# Patient Record
Sex: Female | Born: 1980 | Race: White | Hispanic: No | Smoking: Never smoker
Health system: Southern US, Community
[De-identification: ages and names within clinical notes are randomized; demographics above are authoritative.]

## PROBLEM LIST (undated history)

## (undated) ENCOUNTER — Inpatient Hospital Stay (HOSPITAL_COMMUNITY): Payer: Self-pay

## (undated) DIAGNOSIS — T4145XA Adverse effect of unspecified anesthetic, initial encounter: Secondary | ICD-10-CM

## (undated) DIAGNOSIS — O99345 Other mental disorders complicating the puerperium: Secondary | ICD-10-CM

## (undated) DIAGNOSIS — I499 Cardiac arrhythmia, unspecified: Secondary | ICD-10-CM

## (undated) DIAGNOSIS — G8929 Other chronic pain: Secondary | ICD-10-CM

## (undated) DIAGNOSIS — R12 Heartburn: Secondary | ICD-10-CM

## (undated) DIAGNOSIS — F419 Anxiety disorder, unspecified: Secondary | ICD-10-CM

## (undated) DIAGNOSIS — F329 Major depressive disorder, single episode, unspecified: Secondary | ICD-10-CM

## (undated) DIAGNOSIS — F53 Postpartum depression: Secondary | ICD-10-CM

## (undated) DIAGNOSIS — O26899 Other specified pregnancy related conditions, unspecified trimester: Secondary | ICD-10-CM

## (undated) HISTORY — PX: TONSILLECTOMY: SUR1361

## (undated) HISTORY — DX: Anxiety disorder, unspecified: F41.9

## (undated) HISTORY — DX: Other mental disorders complicating the puerperium: O99.345

## (undated) HISTORY — DX: Postpartum depression: F53.0

## (undated) HISTORY — DX: Other chronic pain: G89.29

---
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
## 2001-08-10 ENCOUNTER — Encounter: Payer: Self-pay | Admitting: Surgery

## 2001-08-10 ENCOUNTER — Encounter: Admission: RE | Admit: 2001-08-10 | Discharge: 2001-08-10 | Payer: Self-pay | Admitting: Surgery

## 2001-08-31 ENCOUNTER — Encounter: Admission: RE | Admit: 2001-08-31 | Discharge: 2001-08-31 | Payer: Self-pay | Admitting: Surgery

## 2001-08-31 ENCOUNTER — Encounter: Payer: Self-pay | Admitting: Surgery

## 2001-11-16 ENCOUNTER — Encounter: Admission: RE | Admit: 2001-11-16 | Discharge: 2001-11-16 | Payer: Self-pay | Admitting: Surgery

## 2001-11-16 ENCOUNTER — Encounter: Payer: Self-pay | Admitting: Surgery

## 2002-02-14 ENCOUNTER — Other Ambulatory Visit: Admission: RE | Admit: 2002-02-14 | Discharge: 2002-02-14 | Payer: Self-pay | Admitting: Obstetrics and Gynecology

## 2002-08-06 ENCOUNTER — Encounter: Payer: Self-pay | Admitting: Neurosurgery

## 2002-08-06 ENCOUNTER — Ambulatory Visit (HOSPITAL_COMMUNITY): Admission: RE | Admit: 2002-08-06 | Discharge: 2002-08-06 | Payer: Self-pay | Admitting: Neurosurgery

## 2002-09-12 ENCOUNTER — Inpatient Hospital Stay (HOSPITAL_COMMUNITY): Admission: AD | Admit: 2002-09-12 | Discharge: 2002-09-17 | Payer: Self-pay | Admitting: Obstetrics and Gynecology

## 2002-09-12 ENCOUNTER — Encounter: Payer: Self-pay | Admitting: Obstetrics and Gynecology

## 2002-10-16 ENCOUNTER — Other Ambulatory Visit: Admission: RE | Admit: 2002-10-16 | Discharge: 2002-10-16 | Payer: Self-pay | Admitting: Obstetrics and Gynecology

## 2003-11-26 ENCOUNTER — Other Ambulatory Visit: Admission: RE | Admit: 2003-11-26 | Discharge: 2003-11-26 | Payer: Self-pay | Admitting: Obstetrics and Gynecology

## 2004-09-06 ENCOUNTER — Encounter: Admission: RE | Admit: 2004-09-06 | Discharge: 2004-09-06 | Payer: Self-pay | Admitting: Neurosurgery

## 2004-09-24 ENCOUNTER — Encounter: Admission: RE | Admit: 2004-09-24 | Discharge: 2004-09-24 | Payer: Self-pay | Admitting: Anesthesiology

## 2005-03-28 ENCOUNTER — Encounter: Admission: RE | Admit: 2005-03-28 | Discharge: 2005-03-28 | Payer: Self-pay | Admitting: Neurosurgery

## 2008-02-14 ENCOUNTER — Encounter (INDEPENDENT_AMBULATORY_CARE_PROVIDER_SITE_OTHER): Payer: Self-pay | Admitting: Obstetrics and Gynecology

## 2008-02-14 ENCOUNTER — Inpatient Hospital Stay (HOSPITAL_COMMUNITY): Admission: RE | Admit: 2008-02-14 | Discharge: 2008-02-16 | Payer: Self-pay | Admitting: Obstetrics and Gynecology

## 2008-12-22 ENCOUNTER — Encounter: Admission: RE | Admit: 2008-12-22 | Discharge: 2008-12-22 | Payer: Self-pay | Admitting: Family Medicine

## 2009-11-08 HISTORY — PX: KNEE ARTHROSCOPY: SHX127

## 2010-11-29 ENCOUNTER — Encounter: Payer: Self-pay | Admitting: Family Medicine

## 2011-01-21 ENCOUNTER — Emergency Department (HOSPITAL_COMMUNITY): Payer: 59

## 2011-01-21 ENCOUNTER — Emergency Department (HOSPITAL_COMMUNITY)
Admission: EM | Admit: 2011-01-21 | Discharge: 2011-01-21 | Disposition: A | Discharge status: Home / Self Care | Payer: 59 | Attending: Emergency Medicine | Admitting: Emergency Medicine

## 2011-01-21 DIAGNOSIS — R0609 Other forms of dyspnea: Secondary | ICD-10-CM | POA: Insufficient documentation

## 2011-01-21 DIAGNOSIS — R079 Chest pain, unspecified: Secondary | ICD-10-CM | POA: Insufficient documentation

## 2011-01-21 DIAGNOSIS — R0989 Other specified symptoms and signs involving the circulatory and respiratory systems: Secondary | ICD-10-CM | POA: Insufficient documentation

## 2011-01-21 DIAGNOSIS — R0602 Shortness of breath: Secondary | ICD-10-CM | POA: Insufficient documentation

## 2011-01-21 DIAGNOSIS — J45909 Unspecified asthma, uncomplicated: Secondary | ICD-10-CM | POA: Insufficient documentation

## 2011-01-21 LAB — POCT CARDIAC MARKERS
Myoglobin, poc: 31.9 ng/mL (ref 12–200)
Troponin i, poc: <0.05 ng/mL (ref 0.00–0.09)

## 2011-01-21 LAB — BASIC METABOLIC PANEL
Calcium: 9.1 mg/dL (ref 8.4–10.5)
Chloride: 105 mEq/L (ref 96–112)
Creatinine, Ser: 0.8 mg/dL (ref 0.4–1.2)
GFR calc Af Amer: >60 mL/min (ref >60)
GFR calc non Af Amer: >60 mL/min (ref >60)
Glucose, Bld: 96 mg/dL (ref 70–99)
Sodium: 137 mEq/L (ref 135–145)

## 2011-01-21 LAB — DIFFERENTIAL
Basophils Absolute: 0.1 10*3/uL (ref 0.0–0.1)
Basophils Relative: 1 % (ref 0–1)
Eosinophils Absolute: 0.1 10*3/uL (ref 0.0–0.7)
Eosinophils Relative: 2 % (ref 0–5)
Lymphocytes Relative: 47 % — ABNORMAL HIGH (ref 12–46)
Lymphs Abs: 4.3 10*3/uL — ABNORMAL HIGH (ref 0.7–4.0)
Monocytes Relative: 5 % (ref 3–12)
Neutro Abs: 4.3 10*3/uL (ref 1.7–7.7)

## 2011-01-21 LAB — CBC
MCH: 31.9 pg (ref 26.0–34.0)
MCV: 90.9 fL (ref 78.0–100.0)
RDW: 12.9 % (ref 11.5–15.5)
WBC: 9.3 10*3/uL (ref 4.0–10.5)

## 2011-01-21 LAB — D-DIMER, QUANTITATIVE: D-Dimer, Quant: 0.22 ug/mL-FEU (ref 0.00–0.48)

## 2011-03-23 NOTE — Op Note (Signed)
Joanne Mcguire, BENBROOK               ACCOUNT NO.:  [id number]   MEDICAL RECORD NO.:  [id number]          PATIENT TYPE:  INP   LOCATION:  9199                          FACILITY:  [facility]   PHYSICIAN:  Lenoard Aden, M.D.DATE OF BIRTH:  1981-07-26   DATE OF PROCEDURE:  02/14/2008  DATE OF DISCHARGE:                               OPERATIVE REPORT   PREOPERATIVE DIAGNOSIS:  Previous cesarean section, 38 weeks, low lying  placenta versus marginal placenta previa.   POSTOPERATIVE DIAGNOSIS:  Previous cesarean section, 38 weeks, marginal  placenta previa.   PROCEDURE:  Repeat low segment transverse cesarean section.   SURGEON:  Lenoard Aden, M.D.   ASSISTANT:  Marlinda Mike, C.N.M.   ANESTHESIA:  Spinal by Dr. Arby Barrette.   ESTIMATED BLOOD LOSS:  1000 mL.   COMPLICATIONS:  None.   DRAINS:  Foley.   COUNTS:  Correct.   DISPOSITION:  Patient to recovery in good condition.   BRIEF OPERATIVE NOTE:  After being apprised of risks of anesthesia,  infection, bleeding, injury to abdominal organs with need for repair,  delayed versus immediate complications to include bowel and bladder  injury, the patient was brought to the operating where she was  administered a spinal anesthetic without complications, prepped and  draped in the usual sterile fashion.  A Foley catheter was placed.  After achieving adequate with dilute Marcaine solution, a skin incision  was made with a scalpel and carried down to the fascia which was nicked  in the midline and entered transversely using Mayo scissors.  The rectus  muscles were dissected sharply in the midline and the peritoneum entered  sharply.  A bladder blade was placed.  The visceral peritoneum was  scored sharply off the lower uterine segment.  Kerr hysterotomy incision  was made. Atraumatic delivery full term living female, occiput transverse  position, handed to the pediatrician in attendance, Apgars 8/9.  Placenta delivered from a posterior  location marginal previa manually  and intact, three vessel cord noted.  Uterus curetted using a dry lap  pack.  Good hemostasis noted.  The uterus closed in two running layer of  0 Monocryl suture.  The tubes and ovaries appear normal.  The posterior  cul-de-sac appears normal.  Good hemostasis is achieved.  The bladder  flap was inspected and found to be  hemostatic.  Irrigation was accomplished.  The fascia was closed using 0  Monocryl in a running fashion, the skin was closed using skin staples.  A dressing is placed.  The patient tolerated the procedure well and was  transferred to recovery in good condition.      Lenoard Aden, M.D.  Electronically Signed     RJT/MEDQ  D:  02/14/2008  T:  02/14/2008  Job:  [id number]

## 2011-03-26 NOTE — Discharge Summary (Signed)
NAMESHAVON, ASHMORE               ACCOUNT NO.:  [id number]   MEDICAL RECORD NO.:  [id number]          PATIENT TYPE:  INP   LOCATION:  9130                          [facility]ILITY:  [facility]   PHYSICIAN:  Lenoard Aden, M.D.DATE OF BIRTH:  [birth year]   DATE OF ADMISSION:  02/14/2008  DATE OF DISCHARGE:  02/16/2008                               DISCHARGE SUMMARY   The patient underwent an uncomplicated repeat C-section.  Postoperative  course uncomplicated.  Tolerated regular diet well.  Discharged to home  day 3.  Discharge teaching done.  Tylox, prenatal vitamins, and iron  given.  Follow up in the office in 4-6 weeks.      Lenoard Aden, M.D.  Electronically Signed     RJT/MEDQ  D:  03/10/2008  T:  03/10/2008  Job:  [id number]

## 2011-03-26 NOTE — H&P (Signed)
Joanne Mcguire, Joanne Mcguire                         ACCOUNT NO.:  [id number]   MEDICAL RECORD NO.:  [id number]                   PATIENT TYPE:   LOCATION:                                       [facility]   PHYSICIAN:  Lenoard Aden, M.D.             DATE OF BIRTH:  [birth year]   DATE OF ADMISSION:  09/12/2002  DATE OF DISCHARGE:                                HISTORY & PHYSICAL   CHIEF COMPLAINT:  Decreased fetal movement and elevated blood pressure in  the office today, rule out preeclampsia.   HISTORY OF PRESENT ILLNESS:  The patient is a 30 year old white female G1,  P0, EDD of October 03, 2002 at [redacted] weeks gestation who presents for  aforementioned indications.   PAST OBSTETRIC HISTORY:  Noncontributory.   ALLERGIES:  Noncontributory.   MEDICATIONS:  Prenatal vitamins, Zofran, Zantac.   PRENATAL LABORATORY DATA:  Blood type O+.  Rh antibody negative.  Rubella  immune.  Hepatitis B surface antigen negative.  HIV nonreactive.  GC/Chlamydia negative.  Group B Strep performed in the office is unavailable  at this time and pending.  The patient's obstetric course has been  complicated by elevation of blood pressure and decreased fetal movement.  She has had elevations of blood pressure in the office as high as 130/100,  history of nausea and vomiting for which she has taken p.r.n. Zantac and  Zofran use.   PHYSICAL EXAMINATION:  GENERAL:  She is a well-appearing white female in no  apparent distress.  HEENT:  Normal.  LUNGS:  Clear.  HEART:  Regular rhythm.  ABDOMEN:  Soft, gravid, and nontender.  Estimated fetal weight on ultrasound  today is in the 90-95th percentile at 640 g, AFI 17.5.  Biophysical profile  6/8.  Fetal heart rate tracing in the 140-150 beat per minute range,  reactive, followed by a two minute deceleration down to 100 and now  reassuring in the 150 range.  PELVIC:  Cervix is closed, 2 cm long, flared, vertex, and -2.  EXTREMITIES:  DTRs 2+ with 1-2+  pretibial edema.  No clonus noted.   LABORATORIES:  CBC reveals a hemoglobin 11.9, hematocrit 35.1, platelet  count 187,000.  Uric acid today is 4.1, AST 13, ALT less than 19,  BUN/creatinine 4 and 0.7.   IMPRESSION:  1. A 37 week intrauterine pregnancy.  2. Decreased fetal movement with biophysical profile of 6/10.  3. Unfavorable cervix.  4. Pregnancy induced hypertension.  No stigmata of preeclampsia at this     time.   PLAN:  Admit for cervical ripening, serial PIH laboratories.  Watch blood  pressure closely.  Proceed with induction and attempts at vaginal delivery.  The patient is apprised of the risks, benefits of proceeding with an  induction.  Preeclampsia is discussed with patient in detail today.  We will  proceed with Cervidil placement and attempts at vaginal birth.  Lenoard Aden, M.D.    RJT/MEDQ  D:  09/12/2002  T:  09/12/2002  Job:  [id number]   cc:   [facility]

## 2011-03-26 NOTE — Op Note (Signed)
Joanne Mcguire, Joanne Mcguire                         ACCOUNT NO.:  [id number]   MEDICAL RECORD NO.:  [id number]                   PATIENT TYPE:  INP   LOCATION:  9101                                 [facility]ILITY:  [facility]   PHYSICIAN:  Lenoard Aden, M.D.             DATE OF BIRTH:  [birth year]   DATE OF PROCEDURE:  09/15/2002  DATE OF DISCHARGE:                                 OPERATIVE REPORT   PREOPERATIVE DIAGNOSES:  1. Thirty-seven week intrauterine pregnancy.  2. Pregnancy-induced hypertension.  3. Failure to descend.  4. Failed vacuum.   POSTOPERATIVE DIAGNOSES:  1. Thirty-seven week intrauterine pregnancy.  2. Pregnancy-induced hypertension.  3. Failure to descend.  4. Failed vacuum.  5. Deep transverse arrest.   PROCEDURE:  Primary low segment transverse cesarean section.   SURGEON:  Lenoard Aden, M.D.   ASSISTANT:  Conni Elliot, M.D.   ANESTHESIA:  Epidural by Quillian Quince, M.D.   ESTIMATED BLOOD LOSS:  1200 cc.   COMPLICATIONS:  None.   DRAINS:  Foley.   COUNTS:  Correct.   DISPOSITION:  Patient to recovery in good condition.   FINDINGS:  A full-term living female, deep transverse arrest.  Apgars 8 and 9.  Placenta manually intact, three-vessel cord.  Patient to recovery in good  condition.   DESCRIPTION OF PROCEDURE:  After achieving adequate epidural anesthesia and  being apprised of the risks of anesthesia, infection, bleeding, injury to  abdominal organs and need for repair, she was brought to the operating room,  where after proper dosing of epidural anesthetic she was prepped and draped  in the usual sterile fashion and a Foley catheter placed.  After achieving  adequate anesthesia, dilute Marcaine was placed in the area of Pfannenstiel  skin incision and the incision made and carried down to the fascia, which  was nicked in the midline and opened transversely using Mayo scissors.  The  rectus muscle was dissected sharply in the midline and  the peritoneum  entered sharply and a bladder blade placed.  Visceral peritoneum was scored  in a smile-like fashion and dissected sharply off the lower uterine segment.  The lower uterine segment was then scored in a smile-like fashion.  Atraumatic delivery from the right occiput transverse incision of a full-  term living female handed to pediatricians in attendance, Apgars 8 and 9.  Placenta delivered manually intact, three-vessel cord noted.  Uterus  exteriorized.  Normal tubes and ovaries noted.  Small cervical laceration in  the midline is noted and closed using a 0 Monocryl, and the incision is then  closed with 0 Monocryl in continuous running fashion, a second imbricating  layer placed, good hemostasis noted, bladder flap inspected, paracolic  gutters irrigated, all blood clot subsequently removed.  Fascia closed using  a 0 Vicryl in continuous running fashion and skin closed using staples.  The  patient tolerated the procedure well and is  transferred to recovery in good  condition.                                               Lenoard Aden, M.D.    RJT/MEDQ  D:  09/15/2002  T:  09/17/2002  Job:  [id number]

## 2011-03-26 NOTE — Discharge Summary (Signed)
   Joanne Mcguire, Joanne Mcguire                         ACCOUNT NO.:  [id number]   MEDICAL RECORD NO.:  [id number]                   PATIENT TYPE:  INP   LOCATION:  9101                                 [facility]   PHYSICIAN:  Lenoard Aden, M.D.             DATE OF BIRTH:  1981/09/13   DATE OF ADMISSION:  09/12/2002  DATE OF DISCHARGE:  09/17/2002                                 DISCHARGE SUMMARY   HISTORY:  The patient underwent uncomplicated primary cesarean section  September 15, 2002 without complications.  Postoperative course uncomplicated.  Hemoglobin 9.1.  Hematocrit 26.6.  Discharged to home postoperative day  number two.  Motrin, Tylox, prenatal vitamins, and iron given.  Follow up in  the office four to six weeks.                                               Lenoard Aden, M.D.    RJT/MEDQ  D:  11/11/2002  T:  11/12/2002  Job:  [id number]

## 2011-08-03 LAB — CBC
HCT: 28.4 — ABNORMAL LOW
Hemoglobin: 12.2
Hemoglobin: 9.8 — ABNORMAL LOW
MCHC: 34
MCHC: 34.5
MCV: 91.7
Platelets: 152
Platelets: 200
RBC: 3.09 — ABNORMAL LOW
RBC: 3.9
RDW: 13
RDW: 13.1
WBC: 10.1

## 2011-08-03 LAB — SYPHILIS: RPR W/REFLEX TO RPR TITER AND TREPONEMAL ANTIBODIES, TRADITIONAL SCREENING AND DIAGNOSIS ALGORITHM: RPR Ser Ql: NONREACTIVE

## 2011-11-10 LAB — OB RESULTS CONSOLE RUBELLA ANTIBODY, IGM: Rubella: IMMUNE

## 2011-11-10 LAB — OB RESULTS CONSOLE HEPATITIS B SURFACE ANTIGEN: Hepatitis B Surface Ag: NEGATIVE

## 2011-11-10 LAB — OB RESULTS CONSOLE ABO/RH: RH Type: POSITIVE

## 2012-05-27 ENCOUNTER — Encounter (HOSPITAL_COMMUNITY): Payer: Self-pay | Admitting: Pharmacist

## 2012-06-01 ENCOUNTER — Other Ambulatory Visit: Payer: Self-pay | Admitting: Obstetrics and Gynecology

## 2012-06-02 ENCOUNTER — Encounter (HOSPITAL_COMMUNITY)
Admission: RE | Admit: 2012-06-02 | Discharge: 2012-06-02 | Disposition: A | Discharge status: Home / Self Care | Payer: 59 | Source: Ambulatory Visit | Attending: Obstetrics and Gynecology | Admitting: Obstetrics and Gynecology

## 2012-06-02 ENCOUNTER — Encounter (HOSPITAL_COMMUNITY): Payer: Self-pay

## 2012-06-02 HISTORY — DX: Adverse effect of unspecified anesthetic, initial encounter: T41.45XA

## 2012-06-02 HISTORY — DX: Heartburn: R12

## 2012-06-02 HISTORY — DX: Other specified pregnancy related conditions, unspecified trimester: O26.899

## 2012-06-02 HISTORY — DX: Major depressive disorder, single episode, unspecified: F32.9

## 2012-06-02 LAB — CBC
MCH: 30.1 pg (ref 26.0–34.0)
MCHC: 33 g/dL (ref 30.0–36.0)
MCV: 91.2 fL (ref 78.0–100.0)
RDW: 13.3 % (ref 11.5–15.5)

## 2012-06-02 LAB — TYPE AND SCREEN
ABO/RH(D): O POS
Antibody Screen: NEGATIVE

## 2012-06-02 LAB — ABO/RH: ABO/RH(D): O POS

## 2012-06-02 NOTE — Patient Instructions (Addendum)
Your procedure is scheduled on:06/09/12  Enter through the Main Entrance at :730 am Pick up desk phone and dial [phone] and inform us of your arrival.  Please call [phone] if you have any problems the morning of surgery.  Remember: Do not eat after midnight:Thursday Do not drink after:midnight Thursday---water ok until 5am  Take these meds the morning of surgery with a sip of water:none  DO NOT wear jewelry, eye make-up, lotion, or dark fingernail polish. Do not shave for 48 hours prior to surgery.  If you are to be admitted after surgery, leave suitcase in car until your room has been assigned. Patients discharged on the day of surgery will not be allowed to drive home.   Remember to use your Hibiclens as instructed.

## 2012-06-03 LAB — RPR: RPR Ser Ql: NONREACTIVE

## 2012-06-04 ENCOUNTER — Encounter (HOSPITAL_COMMUNITY): Payer: Self-pay | Admitting: *Deleted

## 2012-06-04 ENCOUNTER — Inpatient Hospital Stay (HOSPITAL_COMMUNITY)
Admission: AD | Admit: 2012-06-04 | Discharge: 2012-06-04 | Disposition: A | Discharge status: Home / Self Care | Payer: 59 | Source: Ambulatory Visit | Attending: Obstetrics and Gynecology | Admitting: Obstetrics and Gynecology

## 2012-06-04 DIAGNOSIS — O479 False labor, unspecified: Secondary | ICD-10-CM | POA: Insufficient documentation

## 2012-06-04 NOTE — Progress Notes (Signed)
Arlan Organ CNM notified of pt's admission and status. RN to ck cervix and then obs and hour and reck for cervical changes.

## 2012-06-04 NOTE — MAU Note (Signed)
Contractions started about 1900 and very irregular. About 2300 my lower back started hurting. Brown vag. D/c all day Friday.

## 2012-06-04 NOTE — Progress Notes (Signed)
Written and verbal d/c instructions given and understanding voiced. 

## 2012-06-04 NOTE — Progress Notes (Signed)
Colon Flattery CNM notified of pt's cervical reck with no change and ctx pattern, Stable for d/c home. Keep sch appt for Tues.

## 2012-06-08 NOTE — H&P (Signed)
Joanne Mcguire, Joanne Mcguire               ACCOUNT NO.:  [id number]  MEDICAL RECORD NO.:  [id number]  LOCATION:                                 FACILITY:  PHYSICIAN:  Lenoard Aden, M.D.DATE OF BIRTH:  1981-01-10  DATE OF ADMISSION:  06/09/2012 DATE OF DISCHARGE:                             HISTORY & PHYSICAL   CHIEF COMPLAINT:  Previous C-section for repeat.  HISTORY OF PRESENT ILLNESS:  She is a 31 year old white female, G2, P1, previous C-section x1, who presents for elective repeat C-section at 39 weeks.  Her most recent ultrasound revealed an estimated fetal weight in 98th percentile with mild polyhydramnios posterior placenta and estimated fetal weight of 9 pounds.  She had previous history of C- section for deep transverse arrest.  She declines trial of labor to this pregnancy.  MEDICATIONS:  Include Lexapro, prenatal vitamins.  MEDICAL HISTORY:  Remarkable for anxiety.  SURGICAL HISTORY:  Remarkable for a previous C-section as noted.  She has a history of left knee surgery as well.  FAMILY HISTORY:  Remarkable for kidney stones, thyroid disorder, drug abuse, depression, lung cancer, colon cancer, and thyroid disease.  ALLERGIES:  Latex and opioid analgesics, prenatal course as noted.  PHYSICAL EXAMINATION:  GENERAL:  She is a well-developed, well- nourished, white female, in no acute distress. HEENT:  Normal. NECK:  Supple.  Full range of motion. LUNGS:  Clear. HEART:  Regular rate and rhythm. ABDOMEN:  Soft, gravid, nontender.  Estimated fetal weight is noted. Cervix closed, 60%, her fingertip 50%, vertex, -2. EXTREMITIES:  There are no cords. NEUROLOGIC:  Nonfocal. SKIN:  Intact.  IMPRESSION:  A 39-week intrauterine pregnancy, previous C-section for repeat.  PLAN:  Proceed with elective repeat low-segment transverse cesarean section.  Risks of anesthesia, infection, bleeding, injury to abdominal organs, need for repair was discussed, delayed versus  immediate complications to include bowel and bladder injury.  The patient acknowledges and wishes to proceed.     Lenoard Aden, M.D.     RJT/MEDQ  D:  06/08/2012  T:  06/08/2012  Job:  [id number]

## 2012-06-09 ENCOUNTER — Encounter (HOSPITAL_COMMUNITY): Payer: Self-pay | Admitting: Anesthesiology

## 2012-06-09 ENCOUNTER — Encounter (HOSPITAL_COMMUNITY)
Admission: AD | Disposition: A | Discharge status: Home / Self Care | Payer: Self-pay | Source: Ambulatory Visit | Attending: Obstetrics and Gynecology

## 2012-06-09 ENCOUNTER — Inpatient Hospital Stay (HOSPITAL_COMMUNITY): Payer: 59 | Admitting: Anesthesiology

## 2012-06-09 ENCOUNTER — Encounter (HOSPITAL_COMMUNITY): Payer: Self-pay | Admitting: *Deleted

## 2012-06-09 ENCOUNTER — Inpatient Hospital Stay (HOSPITAL_COMMUNITY)
Admission: AD | Admit: 2012-06-09 | Discharge: 2012-06-11 | DRG: 765 | Disposition: A | Discharge status: Home / Self Care | Payer: 59 | Source: Ambulatory Visit | Attending: Obstetrics and Gynecology | Admitting: Obstetrics and Gynecology

## 2012-06-09 DIAGNOSIS — O34219 Maternal care for unspecified type scar from previous cesarean delivery: Principal | ICD-10-CM | POA: Diagnosis present

## 2012-06-09 DIAGNOSIS — O409XX0 Polyhydramnios, unspecified trimester, not applicable or unspecified: Secondary | ICD-10-CM | POA: Diagnosis present

## 2012-06-09 LAB — PREPARE RBC (CROSSMATCH)

## 2012-06-09 SURGERY — Surgical Case
Anesthesia: Spinal | Wound class: Clean Contaminated

## 2012-06-09 MED ORDER — SCOPOLAMINE 1 MG/3DAYS TD PT72
MEDICATED_PATCH | TRANSDERMAL | Status: AC
  Administered 2012-06-09: 1.5 mg via TRANSDERMAL
  Filled 2012-06-09: qty 1

## 2012-06-09 MED ORDER — MIDAZOLAM HCL 2 MG/2ML IJ SOLN: 0.5000 mg | Freq: Once | INTRAMUSCULAR | Status: DC | PRN

## 2012-06-09 MED ORDER — PHENYLEPHRINE 40 MCG/ML (10ML) SYRINGE FOR IV PUSH (FOR BLOOD PRESSURE SUPPORT)
PREFILLED_SYRINGE | INTRAVENOUS | Status: AC
  Filled 2012-06-09: qty 10

## 2012-06-09 MED ORDER — NALBUPHINE HCL 10 MG/ML IJ SOLN
5.0000 mg | INTRAMUSCULAR | Status: DC | PRN
  Administered 2012-06-09: 10 mg via INTRAVENOUS
  Administered 2012-06-10: 5 mg via INTRAVENOUS
  Filled 2012-06-09 (×3): qty 1

## 2012-06-09 MED ORDER — SODIUM CHLORIDE 0.9 % IV SOLN
1.0000 ug/kg/h | INTRAVENOUS | Status: DC | PRN
  Filled 2012-06-09: qty 2.5

## 2012-06-09 MED ORDER — SCOPOLAMINE 1 MG/3DAYS TD PT72
1.0000 | MEDICATED_PATCH | Freq: Once | TRANSDERMAL | Status: DC
  Administered 2012-06-09: 1.5 mg via TRANSDERMAL

## 2012-06-09 MED ORDER — METHYLERGONOVINE MALEATE 0.2 MG PO TABS: 0.2000 mg | ORAL_TABLET | ORAL | Status: DC | PRN

## 2012-06-09 MED ORDER — ONDANSETRON HCL 4 MG/2ML IJ SOLN
INTRAMUSCULAR | Status: AC
  Filled 2012-06-09: qty 2

## 2012-06-09 MED ORDER — DIPHENHYDRAMINE HCL 50 MG/ML IJ SOLN: 25.0000 mg | INTRAMUSCULAR | Status: DC | PRN

## 2012-06-09 MED ORDER — MORPHINE SULFATE (PF) 0.5 MG/ML IJ SOLN
INTRAMUSCULAR | Status: DC | PRN
  Administered 2012-06-09: .1 mg via INTRATHECAL

## 2012-06-09 MED ORDER — ONDANSETRON HCL 4 MG/2ML IJ SOLN: 4.0000 mg | Freq: Three times a day (TID) | INTRAMUSCULAR | Status: DC | PRN

## 2012-06-09 MED ORDER — ACETAMINOPHEN 10 MG/ML IV SOLN
1000.0000 mg | Freq: Four times a day (QID) | INTRAVENOUS | Status: AC | PRN
  Filled 2012-06-09: qty 100

## 2012-06-09 MED ORDER — BUPIVACAINE IN DEXTROSE 0.75-8.25 % IT SOLN
INTRATHECAL | Status: DC | PRN
  Administered 2012-06-09: 1.6 mL via INTRATHECAL

## 2012-06-09 MED ORDER — MEPERIDINE HCL 25 MG/ML IJ SOLN: 6.2500 mg | INTRAMUSCULAR | Status: DC | PRN

## 2012-06-09 MED ORDER — PHENYLEPHRINE HCL 10 MG/ML IJ SOLN
INTRAMUSCULAR | Status: DC | PRN
  Administered 2012-06-09: 120 ug via INTRAVENOUS
  Administered 2012-06-09: 80 ug via INTRAVENOUS
  Administered 2012-06-09: 40 ug via INTRAVENOUS

## 2012-06-09 MED ORDER — LANOLIN HYDROUS EX OINT: 1.0000 "application " | TOPICAL_OINTMENT | CUTANEOUS | Status: DC | PRN

## 2012-06-09 MED ORDER — DIPHENHYDRAMINE HCL 25 MG PO CAPS: 25.0000 mg | ORAL_CAPSULE | Freq: Four times a day (QID) | ORAL | Status: DC | PRN

## 2012-06-09 MED ORDER — SENNOSIDES-DOCUSATE SODIUM 8.6-50 MG PO TABS
2.0000 | ORAL_TABLET | Freq: Every day | ORAL | Status: DC
  Administered 2012-06-09 – 2012-06-10 (×2): 2 via ORAL

## 2012-06-09 MED ORDER — DIBUCAINE 1 % RE OINT: 1.0000 "application " | TOPICAL_OINTMENT | RECTAL | Status: DC | PRN

## 2012-06-09 MED ORDER — SIMETHICONE 80 MG PO CHEW
80.0000 mg | CHEWABLE_TABLET | Freq: Three times a day (TID) | ORAL | Status: DC
  Administered 2012-06-09 – 2012-06-11 (×7): 80 mg via ORAL

## 2012-06-09 MED ORDER — SODIUM CHLORIDE 0.9 % IJ SOLN: 3.0000 mL | INTRAMUSCULAR | Status: DC | PRN

## 2012-06-09 MED ORDER — ONDANSETRON HCL 4 MG PO TABS: 4.0000 mg | ORAL_TABLET | ORAL | Status: DC | PRN

## 2012-06-09 MED ORDER — KETOROLAC TROMETHAMINE 30 MG/ML IJ SOLN
30.0000 mg | Freq: Four times a day (QID) | INTRAMUSCULAR | Status: AC | PRN
  Administered 2012-06-09: 30 mg via INTRAMUSCULAR

## 2012-06-09 MED ORDER — WITCH HAZEL-GLYCERIN EX PADS: 1.0000 "application " | MEDICATED_PAD | CUTANEOUS | Status: DC | PRN

## 2012-06-09 MED ORDER — CEFAZOLIN SODIUM-DEXTROSE 2-3 GM-% IV SOLR
INTRAVENOUS | Status: AC
  Filled 2012-06-09: qty 50

## 2012-06-09 MED ORDER — FENTANYL CITRATE 0.05 MG/ML IJ SOLN
INTRAMUSCULAR | Status: DC | PRN
  Administered 2012-06-09: 25 ug via INTRATHECAL

## 2012-06-09 MED ORDER — 0.9 % SODIUM CHLORIDE (POUR BTL) OPTIME
TOPICAL | Status: DC | PRN
  Administered 2012-06-09: 1000 mL

## 2012-06-09 MED ORDER — ACETAMINOPHEN 10 MG/ML IV SOLN
1000.0000 mg | Freq: Once | INTRAVENOUS | Status: AC
  Administered 2012-06-09: 1000 mg via INTRAVENOUS
  Filled 2012-06-09: qty 100

## 2012-06-09 MED ORDER — NALBUPHINE SYRINGE 5 MG/0.5 ML
INJECTION | INTRAMUSCULAR | Status: AC
Start: 2012-06-09 — End: 2012-06-09
  Administered 2012-06-09: 10 mg via INTRAVENOUS
  Filled 2012-06-09: qty 1

## 2012-06-09 MED ORDER — SCOPOLAMINE 1 MG/3DAYS TD PT72: 1.0000 | MEDICATED_PATCH | Freq: Once | TRANSDERMAL | Status: DC

## 2012-06-09 MED ORDER — METHYLERGONOVINE MALEATE 0.2 MG/ML IJ SOLN: 0.2000 mg | INTRAMUSCULAR | Status: DC | PRN

## 2012-06-09 MED ORDER — OXYTOCIN 10 UNIT/ML IJ SOLN
INTRAMUSCULAR | Status: AC
  Filled 2012-06-09: qty 4

## 2012-06-09 MED ORDER — METOCLOPRAMIDE HCL 5 MG/ML IJ SOLN: 10.0000 mg | Freq: Three times a day (TID) | INTRAMUSCULAR | Status: DC | PRN

## 2012-06-09 MED ORDER — ONDANSETRON HCL 4 MG/2ML IJ SOLN: 4.0000 mg | INTRAMUSCULAR | Status: DC | PRN

## 2012-06-09 MED ORDER — CEFAZOLIN SODIUM-DEXTROSE 2-3 GM-% IV SOLR
2.0000 g | INTRAVENOUS | Status: AC
  Administered 2012-06-09: 2 g via INTRAVENOUS

## 2012-06-09 MED ORDER — OXYTOCIN 40 UNITS IN LACTATED RINGERS INFUSION - SIMPLE MED: 62.5000 mL/h | INTRAVENOUS | Status: AC

## 2012-06-09 MED ORDER — HYDROCODONE-ACETAMINOPHEN 5-325 MG PO TABS
1.0000 | ORAL_TABLET | ORAL | Status: DC | PRN
  Administered 2012-06-10: 1 via ORAL
  Administered 2012-06-10: 2 via ORAL
  Filled 2012-06-09: qty 1
  Filled 2012-06-09: qty 2

## 2012-06-09 MED ORDER — PRENATAL MULTIVITAMIN CH
1.0000 | ORAL_TABLET | Freq: Every day | ORAL | Status: DC
  Administered 2012-06-10 – 2012-06-11 (×2): 1 via ORAL
  Filled 2012-06-09 (×2): qty 1

## 2012-06-09 MED ORDER — KETOROLAC TROMETHAMINE 30 MG/ML IJ SOLN: 30.0000 mg | Freq: Four times a day (QID) | INTRAMUSCULAR | Status: AC | PRN

## 2012-06-09 MED ORDER — DIPHENHYDRAMINE HCL 25 MG PO CAPS
25.0000 mg | ORAL_CAPSULE | ORAL | Status: DC | PRN
  Administered 2012-06-09 – 2012-06-10 (×2): 25 mg via ORAL
  Filled 2012-06-09 (×3): qty 1

## 2012-06-09 MED ORDER — SIMETHICONE 80 MG PO CHEW: 80.0000 mg | CHEWABLE_TABLET | ORAL | Status: DC | PRN

## 2012-06-09 MED ORDER — LACTATED RINGERS IV SOLN
INTRAVENOUS | Status: DC
  Administered 2012-06-09 (×2): via INTRAVENOUS

## 2012-06-09 MED ORDER — MENTHOL 3 MG MT LOZG: 1.0000 | LOZENGE | OROMUCOSAL | Status: DC | PRN

## 2012-06-09 MED ORDER — NALOXONE HCL 0.4 MG/ML IJ SOLN: 0.4000 mg | INTRAMUSCULAR | Status: DC | PRN

## 2012-06-09 MED ORDER — ZOLPIDEM TARTRATE 5 MG PO TABS: 5.0000 mg | ORAL_TABLET | Freq: Every evening | ORAL | Status: DC | PRN

## 2012-06-09 MED ORDER — OXYTOCIN 10 UNIT/ML IJ SOLN
40.0000 [IU] | INTRAVENOUS | Status: DC | PRN
  Administered 2012-06-09: 40 [IU] via INTRAVENOUS

## 2012-06-09 MED ORDER — LACTATED RINGERS IV SOLN
INTRAVENOUS | Status: DC
  Administered 2012-06-09 (×4): via INTRAVENOUS

## 2012-06-09 MED ORDER — TETANUS-DIPHTH-ACELL PERTUSSIS 5-2.5-18.5 LF-MCG/0.5 IM SUSP
0.5000 mL | Freq: Once | INTRAMUSCULAR | Status: AC
  Administered 2012-06-10: 0.5 mL via INTRAMUSCULAR
  Filled 2012-06-09: qty 0.5

## 2012-06-09 MED ORDER — BUPIVACAINE HCL (PF) 0.25 % IJ SOLN
INTRAMUSCULAR | Status: AC
  Filled 2012-06-09: qty 30

## 2012-06-09 MED ORDER — NALBUPHINE HCL 10 MG/ML IJ SOLN
5.0000 mg | INTRAMUSCULAR | Status: DC | PRN
  Administered 2012-06-09: 10 mg via SUBCUTANEOUS
  Filled 2012-06-09 (×2): qty 1

## 2012-06-09 MED ORDER — BUPIVACAINE HCL (PF) 0.25 % IJ SOLN
INTRAMUSCULAR | Status: DC | PRN
  Administered 2012-06-09: 10 mL

## 2012-06-09 MED ORDER — FENTANYL CITRATE 0.05 MG/ML IJ SOLN
INTRAMUSCULAR | Status: AC
  Filled 2012-06-09: qty 2

## 2012-06-09 MED ORDER — KETOROLAC TROMETHAMINE 30 MG/ML IJ SOLN
INTRAMUSCULAR | Status: AC
  Filled 2012-06-09: qty 1

## 2012-06-09 MED ORDER — ONDANSETRON HCL 4 MG/2ML IJ SOLN
INTRAMUSCULAR | Status: DC | PRN
  Administered 2012-06-09: 4 mg via INTRAVENOUS

## 2012-06-09 MED ORDER — MORPHINE SULFATE 0.5 MG/ML IJ SOLN
INTRAMUSCULAR | Status: AC
  Filled 2012-06-09: qty 10

## 2012-06-09 MED ORDER — FENTANYL CITRATE 0.05 MG/ML IJ SOLN: 25.0000 ug | INTRAMUSCULAR | Status: DC | PRN

## 2012-06-09 MED ORDER — IBUPROFEN 600 MG PO TABS
600.0000 mg | ORAL_TABLET | Freq: Four times a day (QID) | ORAL | Status: DC
  Administered 2012-06-09 – 2012-06-11 (×7): 600 mg via ORAL
  Filled 2012-06-09 (×8): qty 1

## 2012-06-09 MED ORDER — PROMETHAZINE HCL 25 MG/ML IJ SOLN: 6.2500 mg | INTRAMUSCULAR | Status: DC | PRN

## 2012-06-09 MED ORDER — DIPHENHYDRAMINE HCL 50 MG/ML IJ SOLN: 12.5000 mg | INTRAMUSCULAR | Status: DC | PRN

## 2012-06-09 MED ORDER — ESCITALOPRAM OXALATE 20 MG PO TABS
20.0000 mg | ORAL_TABLET | Freq: Every day | ORAL | Status: DC
  Administered 2012-06-10 – 2012-06-11 (×2): 20 mg via ORAL
  Filled 2012-06-09 (×3): qty 1

## 2012-06-09 SURGICAL SUPPLY — 33 items
CLOTH BEACON ORANGE TIMEOUT ST (SAFETY) ×2 IMPLANT
CONTAINER PREFILL 10% NBF 15ML (MISCELLANEOUS) IMPLANT
DRESSING TELFA 8X3 (GAUZE/BANDAGES/DRESSINGS) ×2 IMPLANT
ELECT REM PT RETURN 9FT ADLT (ELECTROSURGICAL) ×2
ELECTRODE REM PT RTRN 9FT ADLT (ELECTROSURGICAL) ×1 IMPLANT
EXTRACTOR VACUUM M CUP 4 TUBE (SUCTIONS) IMPLANT
GAUZE SPONGE 4X4 12PLY STRL LF (GAUZE/BANDAGES/DRESSINGS) ×4 IMPLANT
GLOVE BIO SURGEON STRL SZ7.5 (GLOVE) ×4 IMPLANT
GLOVE BIOGEL PI IND STRL 7.0 (GLOVE) ×2 IMPLANT
GLOVE BIOGEL PI INDICATOR 7.0 (GLOVE) ×2
GLOVE ECLIPSE 6.5 STRL STRAW (GLOVE) ×4 IMPLANT
GOWN PREVENTION PLUS LG XLONG (DISPOSABLE) ×4 IMPLANT
GOWN PREVENTION PLUS XLARGE (GOWN DISPOSABLE) ×2 IMPLANT
KIT ABG SYR 3ML LUER SLIP (SYRINGE) IMPLANT
NEEDLE HYPO 25X1 1.5 SAFETY (NEEDLE) ×2 IMPLANT
NEEDLE HYPO 25X5/8 SAFETYGLIDE (NEEDLE) IMPLANT
NS IRRIG 1000ML POUR BTL (IV SOLUTION) ×2 IMPLANT
PACK C SECTION WH (CUSTOM PROCEDURE TRAY) ×2 IMPLANT
PAD ABD 7.5X8 STRL (GAUZE/BANDAGES/DRESSINGS) ×2 IMPLANT
SLEEVE SCD COMPRESS KNEE MED (MISCELLANEOUS) IMPLANT
SPONGE GAUZE 4X4 12PLY (GAUZE/BANDAGES/DRESSINGS) ×2 IMPLANT
STAPLER VISISTAT 35W (STAPLE) ×2 IMPLANT
SUT MNCRL 0 VIOLET CTX 36 (SUTURE) ×2 IMPLANT
SUT MON AB 2-0 CT1 27 (SUTURE) ×2 IMPLANT
SUT MON AB-0 CT1 36 (SUTURE) ×4 IMPLANT
SUT MONOCRYL 0 CTX 36 (SUTURE) ×2
SUT PLAIN 0 NONE (SUTURE) IMPLANT
SUT PLAIN 2 0 XLH (SUTURE) ×2 IMPLANT
SYR CONTROL 10ML LL (SYRINGE) ×2 IMPLANT
TAPE CLOTH SURG 4X10 WHT LF (GAUZE/BANDAGES/DRESSINGS) ×2 IMPLANT
TOWEL OR 17X24 6PK STRL BLUE (TOWEL DISPOSABLE) ×4 IMPLANT
TRAY FOLEY CATH 14FR (SET/KITS/TRAYS/PACK) ×2 IMPLANT
WATER STERILE IRR 1000ML POUR (IV SOLUTION) ×2 IMPLANT

## 2012-06-09 NOTE — Anesthesia Preprocedure Evaluation (Addendum)
Anesthesia Evaluation  Patient identified by MRN, date of birth, ID band Patient awake    Reviewed: Allergy & Precautions, H&P , NPO status , Patient's Chart, lab work & pertinent test results  Airway Mallampati: II      Dental No notable dental hx.    Pulmonary neg pulmonary ROS,  breath sounds clear to auscultation  Pulmonary exam normal       Cardiovascular Exercise Tolerance: Good negative cardio ROS  Rhythm:regular Rate:Normal     Neuro/Psych PSYCHIATRIC DISORDERS negative neurological ROS  negative psych ROS   GI/Hepatic negative GI ROS, Neg liver ROS,   Endo/Other  negative endocrine ROS  Renal/GU negative Renal ROS  negative genitourinary   Musculoskeletal   Abdominal Normal abdominal exam  (+)   Peds  Hematology negative hematology ROS (+)   Anesthesia Other Findings Complication of anesthesia   severe itching p.o CSx2  & knee surgery Heartburn in pregnancy        Depression   h/o pp depression    Reproductive/Obstetrics (+) Pregnancy                           Anesthesia Physical Anesthesia Plan  ASA: II  Anesthesia Plan: Spinal   Post-op Pain Management:    Induction:   Airway Management Planned:   Additional Equipment:   Intra-op Plan:   Post-operative Plan:   Informed Consent: I have reviewed the patients History and Physical, chart, labs and discussed the procedure including the risks, benefits and alternatives for the proposed anesthesia with the patient or authorized representative who has indicated his/her understanding and acceptance.     Plan Discussed with: Anesthesiologist, CRNA and Surgeon  Anesthesia Plan Comments:        Anesthesia Quick Evaluation

## 2012-06-09 NOTE — Anesthesia Procedure Notes (Signed)
Spinal  Patient location during procedure: OR Start time: 06/09/2012 9:19 AM Staffing Anesthesiologist: Brayton Caves R Performed by: anesthesiologist  Preanesthetic Checklist Completed: patient identified, site marked, surgical consent, pre-op evaluation, timeout performed, IV checked, risks and benefits discussed and monitors and equipment checked Spinal Block Patient position: sitting Prep: Betadine Patient monitoring: heart rate, continuous pulse ox and blood pressure Injection technique: single-shot Needle Needle type: Spinocan  Needle gauge: 22 G Needle length: 9 cm Additional Notes Expiration date of kit checked and confirmed. Patient tolerated procedure well, without complications.

## 2012-06-09 NOTE — Progress Notes (Signed)
Patient ID: Joanne Mcguire, female   DOB: 05/29/1981, 31 y.o.   MRN: [id number] Patient seen and examined. Consent witnessed and signed. No changes noted. Update completed.

## 2012-06-09 NOTE — Transfer of Care (Signed)
Immediate Anesthesia Transfer of Care Note  Patient: Joanne Mcguire  Procedure(s) Performed: Procedure(s) (LRB): CESAREAN SECTION (N/A)  Patient Location: PACU  Anesthesia Type: Spinal  Level of Consciousness: awake, alert  and oriented  Airway & Oxygen Therapy: Patient Spontanous Breathing  Post-op Assessment: Report given to PACU RN and Post -op Vital signs reviewed and stable  Post vital signs: Reviewed and stable  Complications: No apparent anesthesia complications

## 2012-06-09 NOTE — Op Note (Signed)
Cesarean Section Procedure Note  Indications: previous uterine incision kerr x2  Pre-operative Diagnosis: 39 week 1 day pregnancy.  Post-operative Diagnosis: same Pelvic adhesions LUS window  Surgeon: Lenoard Aden   Assistants: none  Anesthesia: Local anesthesia 0.25.% bupivacaine and Spinal anesthesia  ASA Class: 2  Procedure Details  The patient was seen in the Holding Room. The risks, benefits, complications, treatment options, and expected outcomes were discussed with the patient.  The patient concurred with the proposed plan, giving informed consent. The risks of anesthesia, infection, bleeding and possible injury to other organs discussed. Injury to bowel, bladder, or ureter with possible need for repair discussed. Possible need for transfusion with secondary risks of hepatitis or HIV acquisition discussed. Post operative complications to include but not limited to DVT, PE and Pneumonia noted. The site of surgery properly noted/marked. The patient was taken to Operating Room # 9, identified as ELLASON SEGAR and the procedure verified as C-Section Delivery. A Time Out was held and the above information confirmed.  After induction of anesthesia, the patient was draped and prepped in the usual sterile manner. A Pfannenstiel incision was made and carried down through the subcutaneous tissue to the fascia. Fascial incision was made and extended transversely using Mayo scissors. The fascia was separated from the underlying rectus tissue superiorly and inferiorly. The peritoneum was identified and entered. Peritoneal incision was extended longitudinally.  The bladder reflection is dissected sharply off the anterior abdominal wall.The utero-vesical peritoneal reflection was incised transversely and the bladder flap was bluntly freed from the lower uterine segment.  Translucent LUS window noted. A low transverse uterine incision(Kerr hysterotomy) was made. Delivered from OA presentation was a   female with Apgar scores of 9 at one minute and 9 at five minutes. Bulb suctioning gently performed. Neonatal team in attendance.After the umbilical cord was clamped and cut cord blood was obtained for evaluation. The placenta was removed intact and appeared normal. The uterus was curetted with a dry lap pack. Good hemostasis was noted.The uterine outline, tubes and ovaries appeared normal. The uterine incision was closed with running locked sutures of 0 Monocryl x 2 layers. Hemostasis was observed. Lavage was carried out until clear.The parietal peritoneum was closed with a running 2-0 Monocryl suture. The fascia was then reapproximated with running sutures of 0 Monocryl. [place] tissue reapproximated with O plain.The skin was reapproximated with staples.  Instrument, sponge, and needle counts were correct prior the abdominal closure and at the conclusion of the case.   Findings: Anterior abdominal wall adhesions LUS window  Estimated Blood Loss:  500         Drains: foley                 Specimens: placenta                 Complications:  None; patient tolerated the procedure well.         Disposition: PACU - hemodynamically stable.         Condition: stable  Attending Attestation: I performed the procedure.

## 2012-06-09 NOTE — Anesthesia Postprocedure Evaluation (Signed)
Anesthesia Post Note  Patient: Joanne Mcguire  Procedure(s) Performed: Procedure(s) (LRB): CESAREAN SECTION (N/A)  Anesthesia type: Spinal  Patient location: PACU  Post pain: Pain level controlled  Post assessment: Post-op Vital signs reviewed  Last Vitals:  Filed Vitals:   06/09/12 1012  BP: 122/55  Pulse: 82  Temp: 36.3 C  Resp: 12    Post vital signs: Reviewed  Level of consciousness: awake  Complications: No apparent anesthesia complications

## 2012-06-10 ENCOUNTER — Encounter (HOSPITAL_COMMUNITY): Payer: Self-pay | Admitting: Obstetrics and Gynecology

## 2012-06-10 DIAGNOSIS — Z98891 History of uterine scar from previous surgery: Secondary | ICD-10-CM

## 2012-06-10 HISTORY — DX: History of uterine scar from previous surgery: Z98.891

## 2012-06-10 LAB — CBC
Platelets: 143 10*3/uL — ABNORMAL LOW (ref 150–400)
RBC: 3.59 MIL/uL — ABNORMAL LOW (ref 3.87–5.11)
WBC: 10.1 10*3/uL (ref 4.0–10.5)

## 2012-06-10 MED ORDER — HYDROMORPHONE HCL 2 MG PO TABS
2.0000 mg | ORAL_TABLET | ORAL | Status: DC | PRN
  Administered 2012-06-11 (×2): 4 mg via ORAL
  Filled 2012-06-10 (×2): qty 2

## 2012-06-10 MED ORDER — FAMOTIDINE 20 MG PO TABS
20.0000 mg | ORAL_TABLET | Freq: Two times a day (BID) | ORAL | Status: DC
  Administered 2012-06-10 – 2012-06-11 (×3): 20 mg via ORAL
  Filled 2012-06-10 (×3): qty 1

## 2012-06-10 MED ORDER — HYDROMORPHONE HCL 2 MG PO TABS
2.0000 mg | ORAL_TABLET | ORAL | Status: DC | PRN
  Administered 2012-06-10 (×3): 2 mg via ORAL
  Administered 2012-06-10: 4 mg via ORAL
  Filled 2012-06-10 (×3): qty 1
  Filled 2012-06-10: qty 2

## 2012-06-10 NOTE — Progress Notes (Signed)
Patient ID: Joanne Mcguire, female   DOB: 1981/01/02, 31 y.o.   MRN: [id number] POD # 1  Subjective: Pt reports feeling well.  Experiencing itching after taking Percocet.  Has never taken Dilaudid, but would like to try for pain control.  Ibuprofen helpful, but no complete relief of pain Tolerating po/ Foley d/c'ed and voiding without problems/ No n/v/Flatus pos Activity: out of bed and ambulate Bleeding is light Newborn info:  Information for the patient's newborn:  Joanne, Mcguire [[id number]]  female  / circ today, per Dr Billy Coast Feeding: bottle   Objective: VS: Blood pressure 123/82, pulse 85, temperature 97.7 F (36.5 C), temperature source Oral, resp. rate 18    Intake/Output Summary (Last 24 hours) at 06/10/12 0943 Last data filed at 06/10/12 0600  Gross per 24 hour  Intake   3535 ml  Output   4550 ml  Net  -1015 ml      Basename 06/10/12 0500  WBC 10.1  HGB 10.6*  HCT 33.1*  PLT 143*    Blood type: --/--/O POS (08/02 4696) Rubella: Immune (01/02 0000)    Physical Exam:  General: alert, cooperative and no distress CV: Regular rate and rhythm Resp: clear Abdomen: soft, nontender, normal bowel sounds Incision: covered with dsg; dry and intact Uterine Fundus: firm, below umbilicus, nontender Lochia: minimal Ext: edema trace and Homans sign is negative, no sign of DVT    A/P: POD # 1/ G3P2103 S/P C/Section d/t planned repeat Will change to dilaudid for improved pain control Doing well Continue routine post op orders   Signed: Demetrius Revel, MSN, [facility] 06/10/2012, 9:43 AM

## 2012-06-10 NOTE — Progress Notes (Signed)

## 2012-06-11 ENCOUNTER — Encounter (HOSPITAL_COMMUNITY): Payer: Self-pay | Admitting: Obstetrics and Gynecology

## 2012-06-11 MED ORDER — HYDROMORPHONE HCL 2 MG PO TABS: 2.0000 mg | ORAL_TABLET | ORAL | Status: AC | PRN

## 2012-06-11 MED ORDER — IBUPROFEN 600 MG PO TABS: 600.0000 mg | ORAL_TABLET | Freq: Four times a day (QID) | ORAL | Status: AC

## 2012-06-11 NOTE — Discharge Summary (Signed)
Obstetric Discharge Summary Reason for Admission: [redacted]wk gestation for planned, repeat c/s/Mild polyhydramnios/Hx depression Prenatal Procedures: NST and ultrasound Intrapartum Procedures: cesarean: low cervical, transverse Postpartum Procedures: none Complications-Operative and Postpartum: none Hemoglobin  Date Value Range Status  06/10/2012 10.6* 12.0 - 15.0 g/dL Final     HCT  Date Value Range Status  06/10/2012 33.1* 36.0 - 46.0 % Final    Physical Exam:  General: alert, cooperative and no distress Lochia: appropriate Uterine Fundus: firm Incision: healing well DVT Evaluation: No evidence of DVT seen on physical exam.  Discharge Diagnoses: Term Pregnancy-delivered  Discharge Information: Date: 06/11/2012 Activity: pelvic rest Diet: routine Medications: Ibuprofen and dilaudid Condition: stable Instructions: refer to practice specific booklet Discharge to: home   Newborn Data: Live born female on 06/09/12 Birth Weight: 8 lb 12.9 oz (3994 g) APGAR: 8, 9  Home with mother.  Shaquanda Graves K 06/11/2012, 10:25 AM

## 2012-06-11 NOTE — Progress Notes (Signed)
Pt on Lexapro.  Patient was referred for history of depression/anxiety. * Referral screened out by Clinical Social Worker because none of the following criteria appear to apply: ~ History of anxiety/depression during this pregnancy, or of post-partum depression. ~ Diagnosis of anxiety and/or depression within last 3 years ~ History of depression due to pregnancy loss/loss of child OR * Patient's symptoms currently being treated with medication and/or therapy. Please contact the Clinical Social Worker if needs arise, or by the patient's request.

## 2012-06-11 NOTE — Progress Notes (Signed)
Patient ID: Joanne Mcguire, female   DOB: November 23, 1980, 31 y.o.   MRN: [id number] POD # 2  Subjective: Pt reports feeling well and eager for d/c home.  Mod pain control with Dilaudid.  Itching has stopped since all adhesives have been removed. Tolerating po/Voiding without problems/ No n/v/Flatus pos Activity: out of bed and ambulate Bleeding is light Newborn info:  Information for the patient's newborn:  Niani, Mourer [[id number]]  female  / circ complete/ Feeding: breast   Objective: VS: Blood pressure 123/81, pulse 93, temperature 97.9 F (36.6 C), temperature source Oral, resp. rate 20.   LABS:  Basename 06/10/12 0500  WBC 10.1  HGB 10.6*  HCT 33.1*  PLT 143*     Physical Exam:  General: alert, cooperative and no distress CV: Regular rate and rhythm Resp: clear Abdomen: soft, nontender, normal bowel sounds Incision: healing well, well approximated Uterine Fundus: firm, below umbilicus, nontender Lochia: minimal Ext: edema trace to +1 and Homans sign is negative, no sign of DVT    A/P: POD # 2/ [id number]/ S/P C/Section d/t planned repeat Doing well and stable for discharge home Staple removal in the office next week.  Will call pt with appointment RX's: Ibuprofen 600mg  po Q 6 hrs prn pain #30 Refill x 1 Dilaudid 2 mg 1 to 2 tabs po prn pain Q 4 hrs #30 no refill Colace 100mg  po BID prn Rt pp visit in 6 weeks.    Signed: Demetrius Revel, MSN, [facility] 06/11/2012, 10:12 AM

## 2012-06-12 ENCOUNTER — Encounter (HOSPITAL_COMMUNITY): Payer: Self-pay | Admitting: Obstetrics and Gynecology

## 2012-06-12 LAB — TYPE AND SCREEN
ABO/RH(D): O POS
Unit division: 0

## 2013-03-10 ENCOUNTER — Other Ambulatory Visit: Payer: Self-pay | Admitting: Nurse Practitioner

## 2013-03-10 MED ORDER — CIPROFLOXACIN HCL 500 MG PO TABS: 500.0000 mg | ORAL_TABLET | Freq: Two times a day (BID) | ORAL | Status: DC

## 2013-03-10 NOTE — Progress Notes (Signed)
Needs a note to get out of work for [employer]

## 2013-03-10 NOTE — Progress Notes (Signed)
Patient just got back from  [place] and has a intestinal infection. Leaving for [place] tomorrow. Needs antibiotic

## 2013-03-12 ENCOUNTER — Telehealth: Payer: Self-pay | Admitting: Nurse Practitioner

## 2013-03-12 ENCOUNTER — Ambulatory Visit: Payer: Self-pay | Admitting: Nurse Practitioner

## 2013-03-12 NOTE — Telephone Encounter (Signed)
APPT MADE

## 2013-03-12 NOTE — Progress Notes (Signed)
OK TO WRITE NOTE

## 2013-04-03 ENCOUNTER — Telehealth: Payer: Self-pay | Admitting: Nurse Practitioner

## 2013-04-03 NOTE — Telephone Encounter (Signed)
APPT MADE FOR FRIDAY

## 2013-04-06 ENCOUNTER — Encounter: Payer: Self-pay | Admitting: General Practice

## 2013-04-06 ENCOUNTER — Ambulatory Visit (INDEPENDENT_AMBULATORY_CARE_PROVIDER_SITE_OTHER): Payer: 59 | Admitting: General Practice

## 2013-04-06 VITALS — BP 126/80 | HR 91 | Temp 98.4°F | Ht 64.25 in | Wt 212.0 lb

## 2013-04-06 DIAGNOSIS — Z0000 Encounter for general adult medical examination without abnormal findings: Secondary | ICD-10-CM

## 2013-04-06 NOTE — Progress Notes (Signed)
  Subjective:    Patient ID: Joanne Mcguire, female    DOB: [birth year], 32 y.o.   MRN: [id number]  HPI Presents today for annual physical which was suggested by her Neuropsychiatrist in [place]. Reports taking medications as directed. Denies regular exercise or healthy diet. Reports seeing neuropsychiatrist to determine if she is suffering from depression vs. Bipolar. She was recently started on Abilify and clonazepam. She discontinued lexapro because it wasn't effective, per her neuropsychiatrist. She is scheduled to go out of the country for three weeks and he is working with her to make her more comfortable during trip.     Review of Systems  Constitutional: Positive for fatigue. Negative for fever, chills and appetite change.  HENT: Negative for ear pain and neck pain.   Eyes: Negative for pain, redness and itching.  Respiratory: Negative for cough, chest tightness, shortness of breath and wheezing.   Cardiovascular: Negative for chest pain and palpitations.  Gastrointestinal: Negative for nausea, vomiting, abdominal pain and anal bleeding.  Genitourinary: Negative for hematuria, flank pain, vaginal discharge, difficulty urinating and pelvic pain.  Musculoskeletal: Negative for back pain and joint swelling.  Skin: Negative.   Neurological: Negative for dizziness, weakness and headaches.  Psychiatric/Behavioral: Positive for sleep disturbance. Negative for suicidal ideas and self-injury. The patient is nervous/anxious.        Being seen in [place] for questionable depression/bipolar.  Reports being stressed about change in job description which requires traveling.  Reports feeling uncomfortable when in crowds.       Objective:   Physical Exam  Constitutional: She is oriented to person, place, and time. She appears well-developed and well-nourished.  HENT:  Head: Normocephalic and atraumatic.  Right Ear: External ear normal.  Left Ear: External ear normal.  Eyes: Conjunctivae  and EOM are normal.  Neck: Normal range of motion. No thyromegaly present.  Cardiovascular: Normal rate, regular rhythm and normal heart sounds.   No murmur heard. Pulmonary/Chest: Effort normal and breath sounds normal. No respiratory distress. She exhibits no tenderness.  Abdominal: Soft. Bowel sounds are normal. She exhibits no distension. There is no tenderness.  Musculoskeletal: Normal range of motion.  Lymphadenopathy:    She has no cervical adenopathy.  Neurological: She is alert and oriented to person, place, and time. She has normal reflexes.  Skin: Skin is warm and dry.  Psychiatric: She has a normal mood and affect.          Assessment & Plan:  1. Annual physical exam  labs pending - POCT CBC - COMPLETE METABOLIC PANEL WITH GFR - Thyroid Panel With TSH - NMR Lipoprofile with Lipids - EKG 12-Lead - Vitamin B12 - Vitamin D 25 hydroxy Increase physical activity as tolerated (advise a regular exercise routine) Discussed health eating habits Discussed proper handwashing and hygiene (patient going out of country for three weeks) Keep appointments with specialists RTO if symptoms develop or worsen Patient verbalized understanding Coralie Keens, FNP-C

## 2013-04-12 ENCOUNTER — Ambulatory Visit: Payer: Self-pay | Admitting: Nurse Practitioner

## 2013-04-19 ENCOUNTER — Telehealth: Payer: Self-pay | Admitting: General Practice

## 2013-04-19 NOTE — Telephone Encounter (Signed)
Will you review last labs. Looks like they need to be collected?? Please advise

## 2013-04-20 ENCOUNTER — Telehealth: Payer: Self-pay | Admitting: General Practice

## 2013-04-20 NOTE — Telephone Encounter (Signed)
Lab is working on getting her results they were sent to [facility]

## 2013-04-20 NOTE — Telephone Encounter (Signed)
Patient needs to come in for lab work

## 2013-04-30 ENCOUNTER — Telehealth: Payer: Self-pay | Admitting: Nurse Practitioner

## 2013-04-30 NOTE — Telephone Encounter (Signed)
Labs on desk

## 2013-04-30 NOTE — Telephone Encounter (Signed)
I spoke with patient and informed her that labs were within normal range. She verbalized understanding and denies any questions.

## 2014-09-09 ENCOUNTER — Encounter: Payer: Self-pay | Admitting: General Practice

## 2014-09-11 ENCOUNTER — Telehealth: Payer: Self-pay | Admitting: Nurse Practitioner

## 2014-09-11 NOTE — Telephone Encounter (Signed)
appt given  

## 2014-09-17 ENCOUNTER — Ambulatory Visit: Payer: Self-pay | Admitting: Nurse Practitioner

## 2015-05-16 ENCOUNTER — Ambulatory Visit (INDEPENDENT_AMBULATORY_CARE_PROVIDER_SITE_OTHER): Payer: 59 | Admitting: Nurse Practitioner

## 2015-05-16 ENCOUNTER — Encounter: Payer: Self-pay | Admitting: Nurse Practitioner

## 2015-05-16 VITALS — BP 115/69 | HR 92 | Temp 97.7°F | Ht 64.0 in | Wt 222.0 lb

## 2015-05-16 DIAGNOSIS — F32A Depression, unspecified: Secondary | ICD-10-CM | POA: Insufficient documentation

## 2015-05-16 DIAGNOSIS — F3161 Bipolar disorder, current episode mixed, mild: Secondary | ICD-10-CM

## 2015-05-16 DIAGNOSIS — F329 Major depressive disorder, single episode, unspecified: Secondary | ICD-10-CM | POA: Diagnosis not present

## 2015-05-16 MED ORDER — VILAZODONE HCL 10 & 20 MG PO KIT: 1.0000 | PACK | Freq: Every day | ORAL | Status: DC

## 2015-05-16 MED ORDER — VILAZODONE HCL 40 MG PO TABS: 40.0000 mg | ORAL_TABLET | Freq: Every day | ORAL | Status: DC

## 2015-05-16 NOTE — Patient Instructions (Signed)

## 2015-05-16 NOTE — Progress Notes (Signed)
   Subjective:    Patient ID: Joanne Mcguire, female    DOB: 19-Dec-1980, 34 y.o.   MRN: [id number]  HPI Patient has been on lexapro for quite sometime- she is currently on 20 mg daily- SHe saysa taht med is not working- She said that she has been have " Major melt downs". SHe went to see Dr.Akintao. He told her she was bipolar and lexapro would not take care f her problems- He started her on benzatropin and abilify which caused nausea and vomiting. So now she is back on her lexapro. Her problem is that she stays anxious and lexapro is not helping. SHe says lexapro did help for awhile.    Review of Systems  Constitutional: Negative.   HENT: Negative.   Respiratory: Negative.   Cardiovascular: Negative.   Gastrointestinal: Negative.   Genitourinary: Negative.   Neurological: Negative.   Psychiatric/Behavioral: Negative.   All other systems reviewed and are negative.      Objective:   Physical Exam  Constitutional: She is oriented to person, place, and time. She appears well-developed and well-nourished.  Cardiovascular: Normal rate, regular rhythm and normal heart sounds.   Pulmonary/Chest: Effort normal and breath sounds normal.  Neurological: She is alert and oriented to person, place, and time.  Skin: Skin is warm and dry.  Psychiatric: She has a normal mood and affect. Her behavior is normal. Judgment and thought content normal.    BP 115/69 mmHg  Pulse 92  Temp(Src) 97.7 F (36.5 C) (Oral)  Ht _0  (1.626 m)  Wt 222 lb (100.699 kg)  BMI 38.09 kg/m2       Assessment & Plan:   1. Depression   2. Bipolar disorder, current episode mixed, mild    Meds ordered this encounter  Medications  . Vilazodone HCl (VIIBRYD STARTER PACK) 10 & 20 MG KIT    Sig: Take 1 tablet by mouth daily.    Dispense:  2 kit    Refill:  0    Order Specific Question:  Supervising Provider    Answer:  Chipper Herb [[id number]]  . Vilazodone HCl (VIIBRYD) 40 MG TABS    Sig: Take 1 tablet (40 mg  total) by mouth daily.    Dispense:  30 tablet    Refill:  5    Order Specific Question:  Supervising Provider    Answer:  Chipper Herb [[id number]]   Patient wants to try antidepressant alone first since lexapro use to work well. Side effects reviewed RTO prn  Mary-Margaret Hassell Done, FNP

## 2015-05-22 ENCOUNTER — Telehealth: Payer: Self-pay | Admitting: Nurse Practitioner

## 2015-05-22 ENCOUNTER — Telehealth: Payer: Self-pay

## 2015-05-22 NOTE — Telephone Encounter (Signed)
Patient has no vm set up.  Please let us know what your side effects from the vibryd are so that Joanne Mcguire may assess and advise appropriately.

## 2015-05-22 NOTE — Telephone Encounter (Signed)
Insurance approved prior authorization for [employer]

## 2015-05-27 NOTE — Telephone Encounter (Signed)
Several attempts have been made to contact patient this encounter will be closed.  

## 2015-08-15 ENCOUNTER — Ambulatory Visit (INDEPENDENT_AMBULATORY_CARE_PROVIDER_SITE_OTHER): Payer: 59 | Admitting: Family Medicine

## 2015-08-15 ENCOUNTER — Encounter: Payer: Self-pay | Admitting: Family Medicine

## 2015-08-15 VITALS — BP 113/78 | HR 106 | Temp 98.0°F | Ht 64.0 in | Wt 222.8 lb

## 2015-08-15 DIAGNOSIS — F32A Depression, unspecified: Secondary | ICD-10-CM

## 2015-08-15 DIAGNOSIS — F329 Major depressive disorder, single episode, unspecified: Secondary | ICD-10-CM | POA: Diagnosis not present

## 2015-08-15 MED ORDER — SERTRALINE HCL 100 MG PO TABS: 100.0000 mg | ORAL_TABLET | Freq: Every day | ORAL | Status: DC

## 2015-08-15 NOTE — Assessment & Plan Note (Signed)
Patient has severe depression and did not like the way the viibryd made her feel. She tried to go a time period without an antidepressant and is been feeling much worse and having thoughts of cutting her arms or doing something. She denies any action plan or that she would actually do something with the thoughts have come.

## 2015-08-15 NOTE — Progress Notes (Signed)
BP 113/78 mmHg  Pulse 106  Temp(Src) 98 F (36.7 C) (Oral)  Ht  (1.626 m)  Wt 222 lb 12.8 oz (101.061 kg)  BMI 38.22 kg/m2  LMP    Subjective:    Patient ID: Joanne Mcguire, female    DOB: 18-Oct-1981, 34 y.o.   MRN: [id number]  HPI: Joanne Mcguire is a 34 y.o. 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 She was on Lexapro for quite a few years and felt like it helped but then it stopped working. She was off of the medication for a few years and just felt like things and got much worse. During this latest year her depression is especially worse because her husband cheated on her about a year ago. They attempted to see a marriage counselor but she has not seen a counselor on her own. She's not tried any other medications besides the Lexapro and Viibryd. She has feelings of crying multiple times throughout the day feelings of general just ill feeling and lack of energy. She feels like it is starting to affect her work. She denies any actual plans of hurting herself or committing suicide but has had the ideations. Her mother did commit suicide from her bipolar quite a few years ago. She was diagnosed with bipolar once in the past herself but denies any manic episodes or issues of lots of energy and sitting up for multiple days. Sleep Issues:  Yes Interests and hobbies decrease: Yes Guilt:  Yes Energy Loss:  Yes Concentration difficulties:  Yes Appetite changes:  No Psychomotor Retardation:  Yes Suicidal Ideations:  Yes Has there ever been a period of time when you were not your usual self and ... - you felt so good or so hyper that other people thought you were not your normal self or you were so hyper that you got into trouble?  No - you were so irritable that you shouted at people or started fights or arguments? No - you felt much more self-confident  than usual? No - you got much less sleep than usual and found that you didn't really miss it? No - you were more talkative or spoke much faster than usual? No - thoughts raced through your head or you couldn't slow your mind down? No - you were so easily distracted by things around you that you had trouble concentrating or stay on track? Yes -you had much more energy than usual? No - you were much more active or did many more things than usual? No - you were much more social or outgoing than usual, for example, you telephoned friends in the middle of the night? No -you were much more interested in sex than usual? No -you did things that were unusual for you or that other people might have thought were excessive, foolish or risky? Yes -spending money got you or your family in trouble? Yes    If you checked yes for more than one of the above, have several of these ever happened during the same period of time? No  How much of a problem did any of these cause you- like being able to work; having family, money or legal troubles; getting in arguments or fights? She has work issues but no legal troubles but these may be caused from her depression.  Have any of your blood relatives had manic-depressive illness or  bipolar disorder?  Yes   Has a health professional ever told you that you have manic-depressive illness or bipolar disorder? Yes                             Based on the history it does not sound like bipolar.     Relevant past medical, surgical, family and social history reviewed and updated as indicated. Interim medical history since our last visit reviewed. Allergies and medications reviewed and updated.  Review of Systems  Constitutional: Negative for fever and chills.  HENT: Negative for congestion, ear discharge and ear pain.   Eyes: Negative for redness and visual disturbance.  Respiratory: Negative for chest tightness and shortness of breath.   Cardiovascular: Negative for  chest pain and leg swelling.  Genitourinary: Negative for dysuria and difficulty urinating.  Musculoskeletal: Negative for back pain and gait problem.  Skin: Negative for rash.  Neurological: Negative for light-headedness and headaches.  Psychiatric/Behavioral: Positive for suicidal ideas (no active plan), sleep disturbance, dysphoric mood, decreased concentration and agitation. Negative for behavioral problems and self-injury. The patient is not hyperactive.   All other systems reviewed and are negative.   Per HPI unless specifically indicated above     Medication List       This list is accurate as of: 08/15/15 12:35 PM.  Always use your most recent med list.               sertraline 100 MG tablet  Commonly known as:  ZOLOFT  Take 1 tablet (100 mg total) by mouth daily. Take 1/2 tablet for 1st 5 days and then increase to whole tablet.           Objective:    BP 113/78 mmHg  Pulse 106  Temp(Src) 98 F (36.7 C) (Oral)  Ht 5\' 4"  (1.626 m)  Wt 222 lb 12.8 oz (101.061 kg)  BMI 38.22 kg/m2  LMP   Wt Readings from Last 3 Encounters:  08/15/15 222 lb 12.8 oz (101.061 kg)  05/16/15 222 lb (100.699 kg)  04/06/13 212 lb (96.163 kg)    Physical Exam  Constitutional: She is oriented to person, place, and time. She appears well-developed and well-nourished. No distress.  Eyes: Conjunctivae and EOM are normal. Pupils are equal, round, and reactive to light.  Cardiovascular: Normal rate, regular rhythm, normal heart sounds and intact distal pulses.   No murmur heard. Pulmonary/Chest: Effort normal and breath sounds normal. No respiratory distress. She has no wheezes.  Musculoskeletal: Normal range of motion. She exhibits no edema or tenderness.  Neurological: She is alert and oriented to person, place, and time. Coordination normal.  Skin: Skin is warm and dry. No rash noted. She is not diaphoretic.  Psychiatric: Her speech is normal and behavior is normal. Judgment normal. Her  mood appears anxious. Her affect is not inappropriate. She is not agitated and not hyperactive. She exhibits a depressed mood. She expresses suicidal (Has had thoughts but denies any actual plans or that she would actually do it because of her 3 children) ideation. She expresses no suicidal plans. She is attentive.  Vitals reviewed.   Results for orders placed or performed during the hospital encounter of 06/09/12  CBC  Result Value Ref Range   WBC 10.1 4.0 - 10.5 K/uL   RBC 3.59 (L) 3.87 - 5.11 MIL/uL   Hemoglobin 10.6 (L) 12.0 - 15.0 g/dL   HCT 82.9 (L) 56.2 - 13.0 %  MCV 92.2 78.0 - 100.0 fL   MCH 29.5 26.0 - 34.0 pg   MCHC 32.0 30.0 - 36.0 g/dL   RDW 36.6 44.0 - 34.7 %   Platelets 143 (L) 150 - 400 K/uL  Prepare RBC (crossmatch)  Result Value Ref Range   Order Confirmation ORDER PROCESSED BY BLOOD BANK   Type and screen  Result Value Ref Range   ABO/RH(D) O POS    Antibody Screen NEG    Sample Expiration 06/12/2012    Unit Number [id number]    Blood Component Type RED CELLS,LR    Unit division 00    Status of Unit REL FROM [facility]    Transfusion Status OK TO TRANSFUSE    Crossmatch Result Compatible    Unit Number [id number]    Blood Component Type RED CELLS,LR    Unit division 00    Status of Unit REL FROM [facility]    Transfusion Status OK TO TRANSFUSE    Crossmatch Result Compatible       Assessment & Plan:   Problem List Items Addressed This Visit      Other   Depression - Primary    Patient has severe depression and did not like the way the viibryd made her feel. She tried to go a time period without an antidepressant and is been feeling much worse and having thoughts of cutting her arms or doing something. She denies any action plan or that she would actually do something with the thoughts have come.      Relevant Medications   sertraline (ZOLOFT) 100 MG tablet       Follow up plan: Return in about 4 weeks (around 09/12/2015), or if symptoms worsen or fail to  improve, for depression recheck.  Arville Care, MD [facility] 08/15/2015, 12:35 PM

## 2015-08-15 NOTE — Patient Instructions (Signed)
Major Depressive Disorder Major depressive disorder is a mental illness. It also may be called clinical depression or unipolar depression. Major depressive disorder usually causes feelings of sadness, hopelessness, or helplessness. Some people with this disorder do not feel particularly sad but lose interest in doing things they used to enjoy (anhedonia). Major depressive disorder also can cause physical symptoms. It can interfere with work, school, relationships, and other normal everyday activities. The disorder varies in severity but is longer lasting and more serious than the sadness we all feel from time to time in our lives. Major depressive disorder often is triggered by stressful life events or major life changes. Examples of these triggers include divorce, loss of your job or home, a move, and the death of a family member or close friend. Sometimes this disorder occurs for no obvious reason at all. People who have family members with major depressive disorder or bipolar disorder are at higher risk for developing this disorder, with or without life stressors. Major depressive disorder can occur at any age. It may occur just once in your life (single episode major depressive disorder). It may occur multiple times (recurrent major depressive disorder). SYMPTOMS People with major depressive disorder have either anhedonia or depressed mood on nearly a daily basis for at least 2 weeks or longer. Symptoms of depressed mood include:  Feelings of sadness (blue or down in the dumps) or emptiness.  Feelings of hopelessness or helplessness.  Tearfulness or episodes of crying (may be observed by others).  Irritability (children and adolescents). In addition to depressed mood or anhedonia or both, people with this disorder have at least four of the following symptoms:  Difficulty sleeping or sleeping too much.   Significant change (increase or decrease) in appetite or weight.   Lack of energy or  motivation.  Feelings of guilt and worthlessness.   Difficulty concentrating, remembering, or making decisions.  Unusually slow movement (psychomotor retardation) or restlessness (as observed by others).   Recurrent wishes for death, recurrent thoughts of self-harm (suicide), or a suicide attempt. People with major depressive disorder commonly have persistent negative thoughts about themselves, other people, and the world. People with severe major depressive disorder may experiencedistorted beliefs or perceptions about the world (psychotic delusions). They also may see or hear things that are not real (psychotic hallucinations). DIAGNOSIS Major depressive disorder is diagnosed through an assessment by your health care provider. Your health care provider will ask aboutaspects of your daily life, such as mood,sleep, and appetite, to see if you have the diagnostic symptoms of major depressive disorder. Your health care provider may ask about your medical history and use of alcohol or drugs, including prescription medicines. Your health care provider also may do a physical exam and blood work. This is because certain medical conditions and the use of certain substances can cause major depressive disorder-like symptoms (secondary depression). Your health care provider also may refer you to a mental health specialist for further evaluation and treatment. TREATMENT It is important to recognize the symptoms of major depressive disorder and seek treatment. The following treatments can be prescribed for this disorder:   Medicine. Antidepressant medicines usually are prescribed. Antidepressant medicines are thought to correct chemical imbalances in the brain that are commonly associated with major depressive disorder. Other types of medicine may be added if the symptoms do not respond to antidepressant medicines alone or if psychotic delusions or hallucinations occur.  Talk therapy. Talk therapy can be  helpful in treating major depressive disorder by providing   support, education, and guidance. Certain types of talk therapy also can help with negative thinking (cognitive behavioral therapy) and with relationship issues that trigger this disorder (interpersonal therapy). A mental health specialist can help determine which treatment is best for you. Most people with major depressive disorder do well with a combination of medicine and talk therapy. Treatments involving electrical stimulation of the brain can be used in situations with extremely severe symptoms or when medicine and talk therapy do not work over time. These treatments include electroconvulsive therapy, transcranial magnetic stimulation, and vagal nerve stimulation.   This information is not intended to replace advice given to you by your health care provider. Make sure you discuss any questions you have with your health care provider.   Document Released: 02/19/2013 Document Revised: 11/15/2014 Document Reviewed: 02/19/2013 Elsevier Interactive Patient Education 2016 [employer]  

## 2015-09-12 ENCOUNTER — Ambulatory Visit (INDEPENDENT_AMBULATORY_CARE_PROVIDER_SITE_OTHER): Payer: 59 | Admitting: Family Medicine

## 2015-09-12 ENCOUNTER — Encounter: Payer: Self-pay | Admitting: Family Medicine

## 2015-09-12 VITALS — BP 124/79 | HR 90 | Temp 98.2°F | Ht 64.0 in | Wt 218.8 lb

## 2015-09-12 DIAGNOSIS — F32A Depression, unspecified: Secondary | ICD-10-CM

## 2015-09-12 DIAGNOSIS — F329 Major depressive disorder, single episode, unspecified: Secondary | ICD-10-CM

## 2015-09-12 MED ORDER — SERTRALINE HCL 100 MG PO TABS: 150.0000 mg | ORAL_TABLET | Freq: Every day | ORAL | Status: DC

## 2015-09-12 NOTE — Progress Notes (Signed)
BP 124/79 mmHg  Pulse 90  Temp(Src) 98.2 F (36.8 C) (Oral)  Ht 5\' 4"  (1.626 m)  Wt 218 lb 12.8 oz (99.247 kg)  BMI 37.54 kg/m2  LMP  (Approximate)   Subjective:    Patient ID: Joanne Mcguire, female    DOB: 01/04/1981, [age] y.o.   MRN: [id number]  HPI: Joanne Mcguire is a 34 y.o. female presenting on 09/12/2015 for Depression   HPI Depression Patient presents today for a recheck on her depression. She has been on Zoloft 100 mg. She feels like it is helping quite a bit but not completely getting her back to where she wants to be. She was unable to go see counselor because work. She is still having a lot of marital problems with her husband. She does not feel like she has the motivation to get up and do things that she usually wants to do some yet. She would say out of the 0-10 and she is feeling more like a 5 right now. She denies any suicidal ideations or thoughts of hurting herself. She declared that she does want to leave her husband and is just trying to workup encouraged to do it.  Relevant past medical, surgical, family and social history reviewed and updated as indicated. Interim medical history since our last visit reviewed. Allergies and medications reviewed and updated.  Review of Systems  Constitutional: Negative for fever and chills.  HENT: Negative for congestion, ear discharge and ear pain.   Eyes: Negative for redness and visual disturbance.  Respiratory: Negative for chest tightness and shortness of breath.   Cardiovascular: Negative for chest pain and leg swelling.  Genitourinary: Negative for dysuria and difficulty urinating.  Musculoskeletal: Negative for back pain and gait problem.  Skin: Negative for rash.  Neurological: Negative for light-headedness and headaches.  Psychiatric/Behavioral: Positive for sleep disturbance, dysphoric mood, decreased concentration and agitation. Negative for suicidal ideas, behavioral problems and self-injury. The patient is not  hyperactive.   All other systems reviewed and are negative.   Per HPI unless specifically indicated above     Medication List       This list is accurate as of: 09/12/15  4:03 PM.  Always use your most recent med list.               sertraline 100 MG tablet  Commonly known as:  ZOLOFT  Take 1.5 tablets (150 mg total) by mouth daily. Take 1/2 tablet for 1st 5 days and then increase to whole tablet.           Objective:    BP 124/79 mmHg  Pulse 90  Temp(Src) 98.2 F (36.8 C) (Oral)  Ht 5\' 4"  (1.626 m)  Wt 218 lb 12.8 oz (99.247 kg)  BMI 37.54 kg/m2  LMP  (Approximate)  Wt Readings from Last 3 Encounters:  09/12/15 218 lb 12.8 oz (99.247 kg)  08/15/15 222 lb 12.8 oz (101.061 kg)  05/16/15 222 lb (100.699 kg)    Physical Exam  Constitutional: She is oriented to person, place, and time. She appears well-developed and well-nourished. No distress.  Eyes: Conjunctivae and EOM are normal. Pupils are equal, round, and reactive to light.  Cardiovascular: Normal rate, regular rhythm, normal heart sounds and intact distal pulses.   No murmur heard. Pulmonary/Chest: Effort normal and breath sounds normal. No respiratory distress. She has no wheezes.  Musculoskeletal: Normal range of motion. She exhibits no edema or tenderness.  Neurological: She is alert and oriented to  person, place, and time. Coordination normal.  Skin: Skin is warm and dry. No rash noted. She is not diaphoretic.  Psychiatric: Her speech is normal and behavior is normal. Judgment and thought content normal. Her mood appears anxious. Her affect is not inappropriate. She is not agitated and not hyperactive. She exhibits a depressed mood. She expresses no suicidal ideation. She expresses no suicidal plans. She is attentive.  Vitals reviewed.   Results for orders placed or performed during the hospital encounter of 06/09/12  CBC  Result Value Ref Range   WBC 10.1 4.0 - 10.5 K/uL   RBC 3.59 (L) 3.87 - 5.11  MIL/uL   Hemoglobin 10.6 (L) 12.0 - 15.0 g/dL   HCT 78.2 (L) 95.6 - 21.3 %   MCV 92.2 78.0 - 100.0 fL   MCH 29.5 26.0 - 34.0 pg   MCHC 32.0 30.0 - 36.0 g/dL   RDW 08.6 57.8 - 46.9 %   Platelets 143 (L) 150 - 400 K/uL  Prepare RBC (crossmatch)  Result Value Ref Range   Order Confirmation ORDER PROCESSED BY BLOOD BANK   Type and screen  Result Value Ref Range   ABO/RH(D) O POS    Antibody Screen NEG    Sample Expiration 06/12/2012    Unit Number [id number]    Blood Component Type RED CELLS,LR    Unit division 00    Status of Unit REL FROM [facility]    Transfusion Status OK TO TRANSFUSE    Crossmatch Result Compatible    Unit Number [id number]    Blood Component Type RED CELLS,LR    Unit division 00    Status of Unit REL FROM [facility]    Transfusion Status OK TO TRANSFUSE    Crossmatch Result Compatible       Assessment & Plan:   Problem List Items Addressed This Visit      Other   Depression - Primary    She is doing a lot better on the Zoloft 100 mg. She does not feel like she is completely to where she wants to be. We will increase the Zoloft to 150 mg and see her back in 4 weeks to see how she is doing. She denies any suicidal ideations today.      Relevant Medications   sertraline (ZOLOFT) 100 MG tablet       Follow up plan: Return in about 4 weeks (around 10/10/2015), or if symptoms worsen or fail to improve, for depression.  Arville Care, MD [facility] 09/12/2015, 4:03 PM

## 2015-09-12 NOTE — Assessment & Plan Note (Signed)
She is doing a lot better on the Zoloft 100 mg. She does not feel like she is completely to where she wants to be. We will increase the Zoloft to 150 mg and see her back in 4 weeks to see how she is doing. She denies any suicidal ideations today.

## 2015-10-03 ENCOUNTER — Ambulatory Visit: Payer: 59 | Admitting: Nurse Practitioner

## 2015-10-10 ENCOUNTER — Ambulatory Visit: Payer: 59 | Admitting: Family Medicine

## 2015-10-17 ENCOUNTER — Encounter (INDEPENDENT_AMBULATORY_CARE_PROVIDER_SITE_OTHER): Payer: Self-pay

## 2015-10-17 ENCOUNTER — Encounter: Payer: Self-pay | Admitting: Family Medicine

## 2015-10-17 ENCOUNTER — Ambulatory Visit (INDEPENDENT_AMBULATORY_CARE_PROVIDER_SITE_OTHER): Payer: 59 | Admitting: Family Medicine

## 2015-10-17 VITALS — BP 129/85 | HR 85 | Temp 97.8°F | Ht 64.0 in | Wt 215.6 lb

## 2015-10-17 DIAGNOSIS — F32A Depression, unspecified: Secondary | ICD-10-CM

## 2015-10-17 DIAGNOSIS — F329 Major depressive disorder, single episode, unspecified: Secondary | ICD-10-CM

## 2015-10-17 MED ORDER — SERTRALINE HCL 100 MG PO TABS: 150.0000 mg | ORAL_TABLET | Freq: Every day | ORAL | Status: DC

## 2015-10-17 NOTE — Assessment & Plan Note (Signed)
Doing well for now and is satisfied staying at this dose at the risk of causing more side effects if we go up more. We'll see back in 3 months. Recommend counseling and she has the number for the crisis line.

## 2015-10-17 NOTE — Progress Notes (Signed)
BP 129/85 mmHg  Pulse 85  Temp(Src) 97.8 F (36.6 C) (Oral)  Ht 5\' 4"  (1.626 m)  Wt 215 lb 9.6 oz (97.796 kg)  BMI 36.99 kg/m2   Subjective:    Patient ID: Joanne Mcguire, female    DOB: 01/09/1981, 34 y.o.   MRN: [id number]  HPI: Joanne Mcguire is a 34 y.o. female presenting on 10/17/2015 for med reheck   HPI Depression recheck Patient comes in today for depression recheck. She is currently taking Zoloft 150 mg daily. She feels like she is doing well on this dose. She feels like she is 90% better just that last 10% is not quite there. She is a lot happier and having a lot more energy and getting up in the morning. She is sleeping at night better and feels refreshed in the morning. She feels like she is still struggling with some social anxiety situations and the anxiety still builds up about once per week to the point where she just has to step away from the situation. She has not seen a counselor yet. We discussed side effects of the medication and she does feel little jittery since we went up to the 150 mg sometimes. She denies any elevated temperature or flushing or increased palpitations.  Relevant past medical, surgical, family and social history reviewed and updated as indicated. Interim medical history since our last visit reviewed. Allergies and medications reviewed and updated.  Review of Systems  Constitutional: Negative for fever and chills.  HENT: Negative for congestion, ear discharge and ear pain.   Eyes: Negative for redness and visual disturbance.  Respiratory: Negative for chest tightness and shortness of breath.   Cardiovascular: Negative for chest pain and leg swelling.  Genitourinary: Negative for dysuria and difficulty urinating.  Musculoskeletal: Negative for back pain and gait problem.  Skin: Negative for rash.  Neurological: Negative for dizziness, light-headedness and headaches.  Psychiatric/Behavioral: Positive for dysphoric mood. Negative for suicidal  ideas, behavioral problems, sleep disturbance, self-injury, decreased concentration and agitation. The patient is nervous/anxious.   All other systems reviewed and are negative.   Per HPI unless specifically indicated above     Medication List       This list is accurate as of: 10/17/15  2:27 PM.  Always use your most recent med list.               sertraline 100 MG tablet  Commonly known as:  ZOLOFT  Take 1.5 tablets (150 mg total) by mouth daily. Take 1/2 tablet for 1st 5 days and then increase to whole tablet.           Objective:    BP 129/85 mmHg  Pulse 85  Temp(Src) 97.8 F (36.6 C) (Oral)  Ht 5\' 4"  (1.626 m)  Wt 215 lb 9.6 oz (97.796 kg)  BMI 36.99 kg/m2  Wt Readings from Last 3 Encounters:  10/17/15 215 lb 9.6 oz (97.796 kg)  09/12/15 218 lb 12.8 oz (99.247 kg)  08/15/15 222 lb 12.8 oz (101.061 kg)    Physical Exam  Constitutional: She is oriented to person, place, and time. She appears well-developed and well-nourished. No distress.  Eyes: Conjunctivae and EOM are normal. Pupils are equal, round, and reactive to light.  Cardiovascular: Normal rate, regular rhythm, normal heart sounds and intact distal pulses.   No murmur heard. Pulmonary/Chest: Effort normal and breath sounds normal. No respiratory distress. She has no wheezes.  Musculoskeletal: Normal range of motion. She exhibits no edema  or tenderness.  Neurological: She is alert and oriented to person, place, and time. Coordination normal.  Skin: Skin is warm and dry. No rash noted. She is not diaphoretic.  Psychiatric: Her speech is normal and behavior is normal. Judgment and thought content normal. Her mood appears anxious. Her affect is not angry, not blunt, not labile and not inappropriate. Cognition and memory are normal. She does not exhibit a depressed mood. She expresses no suicidal ideation. She expresses no suicidal plans.  Nursing note and vitals reviewed.      Assessment & Plan:   Problem  List Items Addressed This Visit      Other   Depression - Primary    Doing well for now and is satisfied staying at this dose at the risk of causing more side effects if we go up more. We'll see back in 3 months. Recommend counseling and she has the number for the crisis line.      Relevant Medications   sertraline (ZOLOFT) 100 MG tablet       Follow up plan: Return in about 3 months (around 01/15/2016), or if symptoms worsen or fail to improve, for depression, anxiety.  Counseling provided for all of the vaccine components No orders of the defined types were placed in this encounter.    Arville Care, MD [facility] 10/17/2015, 2:27 PM

## 2016-01-16 ENCOUNTER — Ambulatory Visit: Payer: 59 | Admitting: Family Medicine

## 2016-01-19 ENCOUNTER — Encounter: Payer: Self-pay | Admitting: Family Medicine

## 2016-01-21 ENCOUNTER — Encounter: Payer: Self-pay | Admitting: Family Medicine

## 2016-01-21 ENCOUNTER — Ambulatory Visit (INDEPENDENT_AMBULATORY_CARE_PROVIDER_SITE_OTHER): Payer: 59 | Admitting: Family Medicine

## 2016-01-21 VITALS — BP 112/80 | HR 90 | Temp 98.2°F | Ht 64.0 in | Wt 215.8 lb

## 2016-01-21 DIAGNOSIS — J209 Acute bronchitis, unspecified: Secondary | ICD-10-CM | POA: Diagnosis not present

## 2016-01-21 DIAGNOSIS — L209 Atopic dermatitis, unspecified: Secondary | ICD-10-CM | POA: Diagnosis not present

## 2016-01-21 MED ORDER — FLUTICASONE PROPIONATE 50 MCG/ACT NA SUSP: 1.0000 | Freq: Two times a day (BID) | NASAL | Status: DC | PRN

## 2016-01-21 MED ORDER — AZITHROMYCIN 250 MG PO TABS: ORAL_TABLET | ORAL | Status: DC

## 2016-01-21 NOTE — Progress Notes (Signed)
BP 112/80 mmHg  Pulse 90  Temp(Src) 98.2 F (36.8 C) (Oral)  Ht  (1.626 m)  Wt 215 lb 12.8 oz (97.886 kg)  BMI 37.02 kg/m2   Subjective:    Patient ID: Joanne Mcguire, female    DOB: 1981-07-11, 35 y.o.   MRN: [id number]  HPI: Joanne Mcguire is a 35 y.o. female presenting on 01/21/2016 for Sinusitis; Bronchitis; and Rash on back of arms   HPI Sinus congestion and chest congestion and cough Patient presents today with sinus congestion and chest congestion and cough and postnasal drainage. She had a fever for 5 days ago and what she thought was the flu because her kids had the flu and then she got better from that mostly but the cough has persisted and now has worsened over the past couple days. She feels like she just can't kick it and then she is having a lot of postnasal drainage and sinus congestion on top of it. She denies any further fevers or chills or shortness of breath or wheezing.  Rash Patient has a rash on the back of her right shoulder that is very pruritic that has been there for the past couple days. She denies any drainage. She does not know exactly what the rash looks like because she can't see it but it is very itchy and she has been having trouble stopping scratching at it.  Relevant past medical, surgical, family and social history reviewed and updated as indicated. Interim medical history since our last visit reviewed. Allergies and medications reviewed and updated.  Review of Systems  Constitutional: Negative for fever and chills.  HENT: Positive for congestion, postnasal drip, rhinorrhea, sinus pressure, sneezing and sore throat. Negative for ear discharge and ear pain.   Eyes: Negative for pain, redness and visual disturbance.  Respiratory: Positive for cough. Negative for chest tightness and shortness of breath.   Cardiovascular: Negative for chest pain and leg swelling.  Genitourinary: Negative for dysuria and difficulty urinating.  Musculoskeletal:  Negative for back pain and gait problem.  Skin: Positive for rash.  Neurological: Negative for light-headedness and headaches.  Psychiatric/Behavioral: Negative for behavioral problems and agitation.  All other systems reviewed and are negative.   Per HPI unless specifically indicated above     Medication List       This list is accurate as of: 01/21/16  4:16 PM.  Always use your most recent med list.               azithromycin 250 MG tablet  Commonly known as:  ZITHROMAX  Take 2 the first day and then one each day after.     fluticasone 50 MCG/ACT nasal spray  Commonly known as:  FLONASE  Place 1 spray into both nostrils 2 (two) times daily as needed for allergies or rhinitis.     sertraline 100 MG tablet  Commonly known as:  ZOLOFT  Take 1.5 tablets (150 mg total) by mouth daily. Take 1/2 tablet for 1st 5 days and then increase to whole tablet.           Objective:    BP 112/80 mmHg  Pulse 90  Temp(Src) 98.2 F (36.8 C) (Oral)  Ht  (1.626 m)  Wt 215 lb 12.8 oz (97.886 kg)  BMI 37.02 kg/m2  Wt Readings from Last 3 Encounters:  01/21/16 215 lb 12.8 oz (97.886 kg)  10/17/15 215 lb 9.6 oz (97.796 kg)  09/12/15 218 lb 12.8 oz (99.247 kg)  Physical Exam  Constitutional: She is oriented to person, place, and time. She appears well-developed and well-nourished. No distress.  HENT:  Right Ear: Tympanic membrane, external ear and ear canal normal.  Left Ear: Tympanic membrane, external ear and ear canal normal.  Nose: Mucosal edema and rhinorrhea present. No epistaxis. Right sinus exhibits no maxillary sinus tenderness and no frontal sinus tenderness. Left sinus exhibits no maxillary sinus tenderness and no frontal sinus tenderness.  Mouth/Throat: Uvula is midline and mucous membranes are normal. Posterior oropharyngeal edema and posterior oropharyngeal erythema present. No oropharyngeal exudate or tonsillar abscesses.  Eyes: Conjunctivae and EOM are normal.  Pupils are equal, round, and reactive to light.  Neck: Neck supple. No thyromegaly present.  Cardiovascular: Normal rate, regular rhythm, normal heart sounds and intact distal pulses.   No murmur heard. Pulmonary/Chest: Effort normal and breath sounds normal. No respiratory distress. She has no wheezes.  Musculoskeletal: Normal range of motion. She exhibits no edema or tenderness.  Lymphadenopathy:    She has no cervical adenopathy.  Neurological: She is alert and oriented to person, place, and time. Coordination normal.  Skin: Skin is warm and dry. Rash noted. Rash is maculopapular (Maculopapular rash with excoriations consistent with atopic dermatitis or eczema). She is not diaphoretic.  Psychiatric: She has a normal mood and affect. Her behavior is normal.  Nursing note and vitals reviewed.  Results for orders placed or performed during the hospital encounter of 06/09/12  CBC  Result Value Ref Range   WBC 10.1 4.0 - 10.5 K/uL   RBC 3.59 (L) 3.87 - 5.11 MIL/uL   Hemoglobin 10.6 (L) 12.0 - 15.0 g/dL   HCT 08.633.1 (L) 57.836.0 - 46.946.0 %   MCV 92.2 78.0 - 100.0 fL   MCH 29.5 26.0 - 34.0 pg   MCHC 32.0 30.0 - 36.0 g/dL   RDW 62.913.5 52.811.5 - 41.315.5 %   Platelets 143 (L) 150 - 400 K/uL  Prepare RBC (crossmatch)  Result Value Ref Range   Order Confirmation ORDER PROCESSED BY BLOOD BANK   Type and screen  Result Value Ref Range   ABO/RH(D) O POS    Antibody Screen NEG    Sample Expiration 06/12/2012    Unit Number [id number]    Blood Component Type RED CELLS,LR    Unit division 00    [facility]    Transfusion Status OK TO TRANSFUSE    Crossmatch Result Compatible    Unit Number [id number]    Blood Component Type RED CELLS,LR    Unit division 00    [facility]    Transfusion Status OK TO TRANSFUSE    Crossmatch Result Compatible       Assessment & Plan:   Problem List Items Addressed This Visit    None    Visit Diagnoses    Atopic dermatitis    -   Primary    use OTC hydrocortisone, on posterior right shoulder    Acute bronchitis, unspecified organism        Patient had the flu last week and then she feels like she is still getting worse from the cough afterwards, no more fevers.    Relevant Medications    azithromycin (ZITHROMAX) 250 MG tablet    fluticasone (FLONASE) 50 MCG/ACT nasal spray        Follow up plan: Return if symptoms worsen or fail to improve.  Counseling provided for all of the vaccine components No orders of  the defined types were placed in this encounter.    Arville Care, MD [facility] 01/21/2016, 4:16 PM

## 2016-03-05 ENCOUNTER — Ambulatory Visit: Payer: 59 | Admitting: Family Medicine

## 2016-03-08 ENCOUNTER — Encounter: Payer: Self-pay | Admitting: Family Medicine

## 2016-03-11 ENCOUNTER — Ambulatory Visit (INDEPENDENT_AMBULATORY_CARE_PROVIDER_SITE_OTHER): Payer: 59 | Admitting: Family Medicine

## 2016-03-11 ENCOUNTER — Encounter: Payer: Self-pay | Admitting: Family Medicine

## 2016-03-11 VITALS — BP 131/79 | HR 83 | Temp 98.1°F | Ht 64.0 in | Wt 216.0 lb

## 2016-03-11 DIAGNOSIS — F329 Major depressive disorder, single episode, unspecified: Secondary | ICD-10-CM | POA: Diagnosis not present

## 2016-03-11 DIAGNOSIS — F32A Depression, unspecified: Secondary | ICD-10-CM

## 2016-03-11 MED ORDER — SERTRALINE HCL 100 MG PO TABS: 150.0000 mg | ORAL_TABLET | Freq: Every day | ORAL | Status: DC

## 2016-03-11 MED ORDER — BUSPIRONE HCL 10 MG PO TABS: 10.0000 mg | ORAL_TABLET | Freq: Two times a day (BID) | ORAL | Status: DC | PRN

## 2016-03-11 NOTE — Progress Notes (Signed)
BP 131/79 mmHg  Pulse 83  Temp(Src) 98.1 F (36.7 C) (Oral)  Ht  (1.626 m)  Wt 216 lb (97.977 kg)  BMI 37.06 kg/m2   Subjective:    Patient ID: Joanne Mcguire, female    DOB: Oct 18, 1981, 35 y.o.   MRN: [id number]  HPI: Joanne Mcguire is a 35 y.o. female presenting on 03/11/2016 for Depression and Anxiety   HPI Depression and anxiety Patient feels like her depression is a lot better than it has been previously and likes being on the dose of 150 mg of Zoloft. She denies any issues with the Zoloft and does not have any tremors or shakes or palpitations or troubles with heat. She denies any suicidal ideations or thoughts of hurting herself. She is sleeping better at night. She says she still just sometimes has anxiety buildup and breakthrough about 2 times a week. She does not know if there is anything that can help with that but she is very hesitant to change the Zoloft as it has been doing so well for her.  Relevant past medical, surgical, family and social history reviewed and updated as indicated. Interim medical history since our last visit reviewed. Allergies and medications reviewed and updated.  Review of Systems  Constitutional: Negative for fever and chills.  HENT: Negative for congestion, ear discharge and ear pain.   Eyes: Negative for redness and visual disturbance.  Respiratory: Negative for chest tightness and shortness of breath.   Cardiovascular: Negative for chest pain and leg swelling.  Genitourinary: Negative for dysuria and difficulty urinating.  Musculoskeletal: Negative for back pain and gait problem.  Skin: Negative for color change and rash.  Neurological: Negative for light-headedness and headaches.  Psychiatric/Behavioral: Positive for dysphoric mood. Negative for suicidal ideas, behavioral problems, sleep disturbance, self-injury and agitation. The patient is nervous/anxious.   All other systems reviewed and are negative.   Per HPI unless  specifically indicated above     Medication List       This list is accurate as of: 03/11/16  5:13 PM.  Always use your most recent med list.               busPIRone 10 MG tablet  Commonly known as:  BUSPAR  Take 1 tablet (10 mg total) by mouth 2 (two) times daily as needed.     fluticasone 50 MCG/ACT nasal spray  Commonly known as:  FLONASE  Place 1 spray into both nostrils 2 (two) times daily as needed for allergies or rhinitis.     sertraline 100 MG tablet  Commonly known as:  ZOLOFT  Take 1.5 tablets (150 mg total) by mouth daily. Take 1/2 tablet for 1st 5 days and then increase to whole tablet.           Objective:    BP 131/79 mmHg  Pulse 83  Temp(Src) 98.1 F (36.7 C) (Oral)  Ht  (1.626 m)  Wt 216 lb (97.977 kg)  BMI 37.06 kg/m2  Wt Readings from Last 3 Encounters:  03/11/16 216 lb (97.977 kg)  01/21/16 215 lb 12.8 oz (97.886 kg)  10/17/15 215 lb 9.6 oz (97.796 kg)    Physical Exam  Constitutional: She is oriented to person, place, and time. She appears well-developed and well-nourished. No distress.  Eyes: Conjunctivae and EOM are normal. Pupils are equal, round, and reactive to light.  Neck: Neck supple. No thyromegaly present.  Cardiovascular: Normal rate, regular rhythm, normal heart sounds and intact distal  pulses.   No murmur heard. Pulmonary/Chest: Effort normal and breath sounds normal. No respiratory distress. She has no wheezes.  Musculoskeletal: Normal range of motion. She exhibits no edema or tenderness.  Lymphadenopathy:    She has no cervical adenopathy.  Neurological: She is alert and oriented to person, place, and time. Coordination normal.  Skin: Skin is warm and dry. No rash noted. She is not diaphoretic.  Psychiatric: Her behavior is normal. Judgment and thought content normal. Her mood appears anxious. She exhibits a depressed mood. She expresses no suicidal ideation. She expresses no suicidal plans.  Nursing note and vitals  reviewed.     Assessment & Plan:   Problem List Items Addressed This Visit      Other   Depression - Primary   Relevant Medications   sertraline (ZOLOFT) 100 MG tablet   busPIRone (BUSPAR) 10 MG tablet      Follow up plan: Return in about 3 months (around 06/11/2016), or if symptoms worsen or fail to improve, for Recheck anxiety and depression.  Counseling provided for all of the vaccine components No orders of the defined types were placed in this encounter.    Arville CareJoshua Dettinger, MD [facility] 03/11/2016, 5:13 PM

## 2016-04-27 ENCOUNTER — Telehealth: Payer: Self-pay | Admitting: Family Medicine

## 2016-05-24 ENCOUNTER — Telehealth: Payer: Self-pay | Admitting: Family Medicine

## 2016-05-24 DIAGNOSIS — F329 Major depressive disorder, single episode, unspecified: Secondary | ICD-10-CM

## 2016-05-24 DIAGNOSIS — F32A Depression, unspecified: Secondary | ICD-10-CM

## 2016-05-24 MED ORDER — SERTRALINE HCL 100 MG PO TABS: 150.0000 mg | ORAL_TABLET | Freq: Every day | ORAL | Status: DC

## 2016-05-24 NOTE — Telephone Encounter (Signed)
done

## 2016-05-31 ENCOUNTER — Ambulatory Visit (INDEPENDENT_AMBULATORY_CARE_PROVIDER_SITE_OTHER): Payer: 59 | Admitting: Family Medicine

## 2016-05-31 ENCOUNTER — Encounter: Payer: Self-pay | Admitting: Family Medicine

## 2016-05-31 VITALS — BP 118/82 | HR 83 | Temp 97.0°F | Ht 64.0 in | Wt 210.6 lb

## 2016-05-31 DIAGNOSIS — F329 Major depressive disorder, single episode, unspecified: Secondary | ICD-10-CM | POA: Diagnosis not present

## 2016-05-31 DIAGNOSIS — F32A Depression, unspecified: Secondary | ICD-10-CM

## 2016-05-31 MED ORDER — SERTRALINE HCL 100 MG PO TABS: 150.0000 mg | ORAL_TABLET | Freq: Every day | ORAL | 3 refills | Status: DC

## 2016-05-31 MED ORDER — HYDROXYZINE HCL 50 MG PO TABS: 50.0000 mg | ORAL_TABLET | Freq: Three times a day (TID) | ORAL | 2 refills | Status: DC | PRN

## 2016-05-31 NOTE — Progress Notes (Signed)
BP 118/82   Pulse 83   Temp 97 F (36.1 C) (Oral)   Ht 5\' 4"  (1.626 m)   Wt 210 lb 9.6 oz (95.5 kg)   BMI 36.15 kg/m    Subjective:    Patient ID: Joanne Mcguire, female    DOB: 06/21/1981, 35 y.o.   MRN: [id number]  HPI: Joanne Mcguire is a 35 y.o. female presenting on 05/31/2016 for Medication Refill (refill Zoloft)   HPI Anxiety depression recheck Patient comes in today for anxiety and depression recheck. She says she has been doing well on her Zoloft for the most part except for her anxiety breakthrough days. She says she gets about once a week where her anxiety breaks through and is just overwhelming. Especially when she is traveling a lot for work and having a lot of work stress she has these breakthrough days. She was using the BuSpar but did not feel like it helped at all. She denies any suicidal ideations or thoughts of hurting herself. She says her depression is been very controlled and she does not feel sad or down like she did previously.  Relevant past medical, surgical, family and social history reviewed and updated as indicated. Interim medical history since our last visit reviewed. Allergies and medications reviewed and updated.  Review of Systems  Constitutional: Negative for chills and fever.  HENT: Negative for congestion, ear discharge and ear pain.   Eyes: Negative for redness and visual disturbance.  Respiratory: Negative for chest tightness and shortness of breath.   Cardiovascular: Negative for chest pain and leg swelling.  Genitourinary: Negative for difficulty urinating and dysuria.  Musculoskeletal: Negative for back pain and gait problem.  Skin: Negative for rash.  Neurological: Negative for light-headedness and headaches.  Psychiatric/Behavioral: Positive for decreased concentration, dysphoric mood and sleep disturbance. Negative for agitation, behavioral problems, self-injury and suicidal ideas. The patient is nervous/anxious.   All other systems  reviewed and are negative.   Per HPI unless specifically indicated above     Medication List       Accurate as of 05/31/16  8:46 AM. Always use your most recent med list.          fluticasone 50 MCG/ACT nasal spray Commonly known as:  FLONASE Place 1 spray into both nostrils 2 (two) times daily as needed for allergies or rhinitis.   hydrOXYzine 50 MG tablet Commonly known as:  ATARAX/VISTARIL Take 1 tablet (50 mg total) by mouth 3 (three) times daily as needed.   sertraline 100 MG tablet Commonly known as:  ZOLOFT Take 1.5 tablets (150 mg total) by mouth daily.          Objective:    BP 118/82   Pulse 83   Temp 97 F (36.1 C) (Oral)   Ht 5\' 4"  (1.626 m)   Wt 210 lb 9.6 oz (95.5 kg)   BMI 36.15 kg/m   Wt Readings from Last 3 Encounters:  05/31/16 210 lb 9.6 oz (95.5 kg)  03/11/16 216 lb (98 kg)  01/21/16 215 lb 12.8 oz (97.9 kg)    Physical Exam  Constitutional: She is oriented to person, place, and time. She appears well-developed and well-nourished. No distress.  Eyes: Conjunctivae and EOM are normal. Pupils are equal, round, and reactive to light.  Cardiovascular: Normal rate, regular rhythm, normal heart sounds and intact distal pulses.   No murmur heard. Pulmonary/Chest: Effort normal and breath sounds normal. No respiratory distress. She has no wheezes.  Musculoskeletal: Normal  range of motion. She exhibits no edema or tenderness.  Neurological: She is alert and oriented to person, place, and time. Coordination normal.  Skin: Skin is warm and dry. No rash noted. She is not diaphoretic.  Psychiatric: Her behavior is normal. Judgment and thought content normal. Her mood appears anxious. She exhibits a depressed mood. She expresses no suicidal ideation. She expresses no suicidal plans.  Nursing note and vitals reviewed.     Assessment & Plan:   Problem List Items Addressed This Visit      Other   Depression - Primary   Relevant Medications    hydrOXYzine (ATARAX/VISTARIL) 50 MG tablet   sertraline (ZOLOFT) 100 MG tablet    Other Visit Diagnoses   None.      Follow up plan: Return in about 3 months (around 08/31/2016), or if symptoms worsen or fail to improve, for Follow-up anxiety.  Counseling provided for all of the vaccine components No orders of the defined types were placed in this encounter.   Arville Care, MD [facility] 05/31/2016, 8:46 AM

## 2016-08-05 ENCOUNTER — Ambulatory Visit (INDEPENDENT_AMBULATORY_CARE_PROVIDER_SITE_OTHER): Payer: 59

## 2016-08-05 ENCOUNTER — Encounter: Payer: Self-pay | Admitting: Family Medicine

## 2016-08-05 ENCOUNTER — Ambulatory Visit (INDEPENDENT_AMBULATORY_CARE_PROVIDER_SITE_OTHER): Payer: 59 | Admitting: Family Medicine

## 2016-08-05 VITALS — BP 107/74 | HR 85 | Temp 98.3°F | Ht 64.0 in | Wt 208.0 lb

## 2016-08-05 DIAGNOSIS — K5900 Constipation, unspecified: Secondary | ICD-10-CM

## 2016-08-05 DIAGNOSIS — R1030 Lower abdominal pain, unspecified: Secondary | ICD-10-CM

## 2016-08-05 LAB — MICROSCOPIC EXAMINATION: BACTERIA UA: NONE SEEN

## 2016-08-05 LAB — URINALYSIS, COMPLETE
Bilirubin, UA: NEGATIVE
GLUCOSE, UA: NEGATIVE
Ketones, UA: NEGATIVE
LEUKOCYTES UA: NEGATIVE
Nitrite, UA: NEGATIVE
PH UA: 5.5 (ref 5.0–7.5)
PROTEIN UA: NEGATIVE
Specific Gravity, UA: 1.02 (ref 1.005–1.030)
UUROB: 0.2 mg/dL (ref 0.2–1.0)

## 2016-08-05 MED ORDER — POLYETHYLENE GLYCOL 3350 17 GM/SCOOP PO POWD: 17.0000 g | Freq: Every day | ORAL | 1 refills | Status: DC

## 2016-08-05 NOTE — Progress Notes (Signed)
BP 107/74   Pulse 85   Temp 98.3 F (36.8 C) (Oral)   Ht _0  (1.626 m)   Wt 208 lb (94.3 kg)   BMI 35.70 kg/m    Subjective:    Patient ID: Joanne Mcguire, female    DOB: 19-Mar-1981, 35 y.o.   MRN: [id number]  HPI: Joanne Mcguire is a 35 y.o. female presenting on 08/05/2016 for Abdominal Pain (saw ob-gyn 2 weeks ago who did an ultrasound and it was normal; still having pain in lower abdomen, wants to have labs and get your opinion.  was told IUD is in place, no cysts on ovaries.)   HPI Lower abdominal pain Patient comes in today with complaints of lower abdominal pain and urinary frequency and intermittent constipation and diarrhea that has been increasing over the past 2-3 months. She saw her OB/GYN about 2 weeks ago who did a pelvic and transvaginal ultrasound did not find any female issues for why this could be happening, they also found that the IUD was still in place and she did not have any cysts on her ovaries. She comes in today still complaining of lower abdominal pain that is worse in the right lower quadrant and extends around to her right flank. She denies any fevers or chills. She denies any blood in her urine or blood in her stool. She did have a Pap with her OB/GYN as well. She denies any nausea or vomiting.  Relevant past medical, surgical, family and social history reviewed and updated as indicated. Interim medical history since our last visit reviewed. Allergies and medications reviewed and updated.  Review of Systems  Constitutional: Negative for chills and fever.  HENT: Negative for congestion, ear discharge and ear pain.   Eyes: Negative for redness and visual disturbance.  Respiratory: Negative for chest tightness and shortness of breath.   Cardiovascular: Negative for chest pain and leg swelling.  Gastrointestinal: Positive for abdominal pain, constipation and diarrhea. Negative for abdominal distention, anal bleeding, blood in stool, nausea, rectal pain and  vomiting.  Genitourinary: Positive for flank pain, frequency, urgency and vaginal bleeding (Vaginal spotting for past couple days but not the whole time that she's had this). Negative for decreased urine volume, difficulty urinating, dysuria, vaginal discharge and vaginal pain.  Musculoskeletal: Negative for back pain and gait problem.  Skin: Negative for rash.  Neurological: Negative for light-headedness and headaches.  Psychiatric/Behavioral: Negative for agitation and behavioral problems.  All other systems reviewed and are negative.   Per HPI unless specifically indicated above      Objective:    BP 107/74   Pulse 85   Temp 98.3 F (36.8 C) (Oral)   Ht _1  (1.626 m)   Wt 208 lb (94.3 kg)   BMI 35.70 kg/m   Wt Readings from Last 3 Encounters:  08/05/16 208 lb (94.3 kg)  05/31/16 210 lb 9.6 oz (95.5 kg)  03/11/16 216 lb (98 kg)    Physical Exam  Constitutional: She is oriented to person, place, and time. She appears well-developed and well-nourished. No distress.  Eyes: Conjunctivae are normal.  Cardiovascular: Normal rate, regular rhythm, normal heart sounds and intact distal pulses.   No murmur heard. Pulmonary/Chest: Effort normal and breath sounds normal. No respiratory distress. She has no wheezes.  Abdominal: Soft. Bowel sounds are normal. She exhibits no distension. There is no hepatosplenomegaly. There is tenderness in the right lower quadrant and suprapubic area. There is no rigidity, no rebound, no guarding  and no CVA tenderness. No hernia.  Musculoskeletal: Normal range of motion. She exhibits no edema or tenderness.  Neurological: She is alert and oriented to person, place, and time. Coordination normal.  Skin: Skin is warm and dry. No rash noted. She is not diaphoretic.  Psychiatric: She has a normal mood and affect. Her behavior is normal.  Nursing note and vitals reviewed.  KUB: KUB shows signs of stool all the way over into the descending and ascending  colon all the way onto the right side of her abdomen. She does have an abnormal gas bowel loop. Await final read by radiologist    Assessment & Plan:   Problem List Items Addressed This Visit    None    Visit Diagnoses    Lower abdominal pain    -  Primary   Relevant Orders   Urinalysis, Complete   CBC with Differential/Platelet   BMP8+EGFR   Lipase   DG Abd 1 View   Constipation, unspecified constipation type       Relevant Orders   CBC with Differential/Platelet   BMP8+EGFR   Lipase   DG Abd 1 View       Follow up plan: Return if symptoms worsen or fail to improve.  Counseling provided for all of the vaccine components Orders Placed This Encounter  Procedures  . DG Abd 1 View  . Urinalysis, Complete  . CBC with Differential/Platelet  . BMP8+EGFR  . Lipase    Caryl Pina, MD [facility] 08/05/2016, 4:50 PM

## 2016-09-20 ENCOUNTER — Ambulatory Visit: Payer: 59 | Admitting: Family Medicine

## 2016-09-22 ENCOUNTER — Encounter: Payer: Self-pay | Admitting: Family Medicine

## 2016-10-06 ENCOUNTER — Ambulatory Visit: Payer: 59 | Admitting: Family Medicine

## 2016-10-15 ENCOUNTER — Ambulatory Visit: Payer: 59 | Admitting: Family Medicine

## 2016-11-11 ENCOUNTER — Ambulatory Visit (INDEPENDENT_AMBULATORY_CARE_PROVIDER_SITE_OTHER): Payer: 59 | Admitting: Family Medicine

## 2016-11-11 DIAGNOSIS — F329 Major depressive disorder, single episode, unspecified: Secondary | ICD-10-CM

## 2016-11-11 MED ORDER — SERTRALINE HCL 100 MG PO TABS: 150.0000 mg | ORAL_TABLET | Freq: Every day | ORAL | 0 refills | Status: DC

## 2016-11-12 NOTE — Progress Notes (Signed)
Erroneous entry because patient had to leave before being able to see the physician.

## 2016-11-26 ENCOUNTER — Encounter: Payer: Self-pay | Admitting: Family Medicine

## 2016-11-26 ENCOUNTER — Ambulatory Visit (INDEPENDENT_AMBULATORY_CARE_PROVIDER_SITE_OTHER): Payer: 59 | Admitting: Family Medicine

## 2016-11-26 VITALS — BP 124/73 | HR 94 | Temp 98.0°F | Ht 64.0 in | Wt 216.6 lb

## 2016-11-26 DIAGNOSIS — G8929 Other chronic pain: Secondary | ICD-10-CM

## 2016-11-26 DIAGNOSIS — R1031 Right lower quadrant pain: Secondary | ICD-10-CM | POA: Diagnosis not present

## 2016-11-26 DIAGNOSIS — Z30011 Encounter for initial prescription of contraceptive pills: Secondary | ICD-10-CM | POA: Diagnosis not present

## 2016-11-26 DIAGNOSIS — Z30432 Encounter for removal of intrauterine contraceptive device: Secondary | ICD-10-CM

## 2016-11-26 DIAGNOSIS — F331 Major depressive disorder, recurrent, moderate: Secondary | ICD-10-CM

## 2016-11-26 MED ORDER — VENLAFAXINE HCL ER 75 MG PO CP24: 75.0000 mg | ORAL_CAPSULE | Freq: Every day | ORAL | 1 refills | Status: DC

## 2016-11-26 MED ORDER — VENLAFAXINE HCL ER 37.5 MG PO CP24: 37.5000 mg | ORAL_CAPSULE | Freq: Every day | ORAL | 0 refills | Status: DC

## 2016-11-26 MED ORDER — NORGESTIMATE-ETH ESTRADIOL 0.25-35 MG-MCG PO TABS: 1.0000 | ORAL_TABLET | Freq: Every day | ORAL | 11 refills | Status: DC

## 2016-11-26 NOTE — Progress Notes (Signed)
BP 124/73   Pulse 94   Temp 98 F (36.7 C) (Oral)   Ht 5\' 4"  (1.626 m)   Wt 216 lb 9.6 oz (98.2 kg)   BMI 37.18 kg/m    Subjective:    Patient ID: Joanne Mcguire, female    DOB: 04/03/1981, 36 y.o.   MRN: [id number]  HPI: Joanne Mcguire is a 36 y.o. female presenting on 11/26/2016 for Contraception (opinion on IUD removal)   HPI Lower abdominal pain and contraception discussion. Patient is coming in today for lower abdominal pain and change in contraception. She has discussed this with her OB/GYN and they do agree that there is a possibility that her lower abdominal pain on the right lower side could be due to the IUD and she would like to go ahead and have it removed. We discussed other forms of birth control and she would like to go back onto oral contraception and try that.   Major depression recheck Patient says she is doing okay on the Zoloft and she had increased it to 200 mg in his is not quite cutting it completely. She denies any suicidal ideations or thoughts of hurting herself. She just is feeling down some of the time and has having to use her Atarax more frequently over the past short while. She has tried multiple SSRIs but has never tried an SNRI previously. She has tried Wellbutrin before and benzodiazepine.  Relevant past medical, surgical, family and social history reviewed and updated as indicated. Interim medical history since our last visit reviewed. Allergies and medications reviewed and updated.  Review of Systems  Constitutional: Negative for chills and fever.  HENT: Negative for congestion, ear discharge and ear pain.   Eyes: Negative for redness and visual disturbance.  Respiratory: Negative for chest tightness and shortness of breath.   Cardiovascular: Negative for chest pain and leg swelling.  Genitourinary: Positive for pelvic pain. Negative for difficulty urinating and dysuria.  Musculoskeletal: Negative for back pain and gait problem.  Skin: Negative  for color change and rash.  Neurological: Negative for light-headedness and headaches.  Psychiatric/Behavioral: Positive for decreased concentration and dysphoric mood. Negative for agitation, behavioral problems, self-injury, sleep disturbance and suicidal ideas. The patient is nervous/anxious.   All other systems reviewed and are negative.   Per HPI unless specifically indicated above     Objective:    BP 124/73   Pulse 94   Temp 98 F (36.7 C) (Oral)   Ht 5\' 4"  (1.626 m)   Wt 216 lb 9.6 oz (98.2 kg)   BMI 37.18 kg/m   Wt Readings from Last 3 Encounters:  11/26/16 216 lb 9.6 oz (98.2 kg)  08/05/16 208 lb (94.3 kg)  05/31/16 210 lb 9.6 oz (95.5 kg)    Physical Exam  Constitutional: She is oriented to person, place, and time. She appears well-developed and well-nourished. No distress.  Eyes: Conjunctivae are normal.  Cardiovascular: Normal rate, regular rhythm, normal heart sounds and intact distal pulses.   No murmur heard. Pulmonary/Chest: Effort normal and breath sounds normal. No respiratory distress. She has no wheezes.  Genitourinary: Vagina normal and uterus normal. Uterus is not enlarged and not tender. Cervix exhibits no motion tenderness, no discharge and no friability. Right adnexum displays tenderness. Right adnexum displays no mass and no fullness. Left adnexum displays no mass, no tenderness and no fullness.  Musculoskeletal: Normal range of motion. She exhibits no edema or tenderness.  Neurological: She is alert and oriented to  person, place, and time. Coordination normal.  Skin: Skin is warm and dry. No rash noted. She is not diaphoretic.  Psychiatric: Her behavior is normal. Judgment and thought content normal. Her mood appears anxious. She exhibits a depressed mood. She expresses no suicidal ideation. She expresses no suicidal plans.  Nursing note and vitals reviewed.  IUD removal: Insert a speculum commonly used Betadine to wrap, able to visualize straining and  grabbed with ring forceps and able to remove IUD intact without complication. No bleeding and patient tolerated well.    Assessment & Plan:   Problem List Items Addressed This Visit      Other   Depression   Relevant Medications   venlafaxine XR (EFFEXOR XR) 37.5 MG 24 hr capsule   venlafaxine XR (EFFEXOR XR) 75 MG 24 hr capsule    Other Visit Diagnoses    Encounter for IUD removal    -  Primary   Abdominal pain, chronic, right lower quadrant       Concern from patient that it may be related to her IUD, will remove IUD and switch her to oral contraception   Encounter for initial prescription of contraceptive pills       Relevant Medications   norgestimate-ethinyl estradiol (ORTHO-CYCLEN, 28,) 0.25-35 MG-MCG tablet       Follow up plan: Return in about 6 weeks (around 01/07/2017), or if symptoms worsen or fail to improve, for Recheck anxiety and depression.  Counseling provided for all of the vaccine components No orders of the defined types were placed in this encounter.   Arville Care, MD [facility] 11/26/2016, 4:11 PM

## 2016-12-23 ENCOUNTER — Ambulatory Visit: Payer: 59 | Admitting: Pediatrics

## 2016-12-24 ENCOUNTER — Other Ambulatory Visit: Payer: Self-pay

## 2016-12-24 ENCOUNTER — Encounter: Payer: Self-pay | Admitting: Family Medicine

## 2016-12-24 DIAGNOSIS — F331 Major depressive disorder, recurrent, moderate: Secondary | ICD-10-CM

## 2016-12-24 MED ORDER — VENLAFAXINE HCL ER 75 MG PO CP24: 75.0000 mg | ORAL_CAPSULE | Freq: Every day | ORAL | 1 refills | Status: DC

## 2017-01-07 ENCOUNTER — Ambulatory Visit: Payer: 59 | Admitting: Family Medicine

## 2017-01-21 DIAGNOSIS — R1084 Generalized abdominal pain: Secondary | ICD-10-CM | POA: Diagnosis not present

## 2017-02-23 DIAGNOSIS — K582 Mixed irritable bowel syndrome: Secondary | ICD-10-CM | POA: Diagnosis not present

## 2017-02-23 DIAGNOSIS — R1030 Lower abdominal pain, unspecified: Secondary | ICD-10-CM | POA: Diagnosis not present

## 2017-02-23 DIAGNOSIS — R112 Nausea with vomiting, unspecified: Secondary | ICD-10-CM | POA: Diagnosis not present

## 2017-02-24 ENCOUNTER — Other Ambulatory Visit: Payer: Self-pay | Admitting: Gastroenterology

## 2017-02-24 DIAGNOSIS — K589 Irritable bowel syndrome without diarrhea: Secondary | ICD-10-CM

## 2017-02-24 DIAGNOSIS — R1030 Lower abdominal pain, unspecified: Secondary | ICD-10-CM

## 2017-02-24 DIAGNOSIS — R112 Nausea with vomiting, unspecified: Secondary | ICD-10-CM

## 2017-03-01 ENCOUNTER — Ambulatory Visit
Admission: RE | Admit: 2017-03-01 | Discharge: 2017-03-01 | Disposition: A | Discharge status: Home / Self Care | Payer: 59 | Source: Ambulatory Visit | Attending: Gastroenterology | Admitting: Gastroenterology

## 2017-03-01 DIAGNOSIS — R1030 Lower abdominal pain, unspecified: Secondary | ICD-10-CM

## 2017-03-01 DIAGNOSIS — K589 Irritable bowel syndrome without diarrhea: Secondary | ICD-10-CM

## 2017-03-01 DIAGNOSIS — R112 Nausea with vomiting, unspecified: Secondary | ICD-10-CM

## 2017-03-01 MED ORDER — IOPAMIDOL (ISOVUE-300) INJECTION 61%
125.0000 mL | Freq: Once | INTRAVENOUS | Status: AC | PRN
  Administered 2017-03-01: 125 mL via INTRAVENOUS

## 2017-04-19 ENCOUNTER — Other Ambulatory Visit: Payer: Self-pay | Admitting: Family Medicine

## 2017-04-19 DIAGNOSIS — Z30011 Encounter for initial prescription of contraceptive pills: Secondary | ICD-10-CM

## 2017-09-13 DIAGNOSIS — Z01419 Encounter for gynecological examination (general) (routine) without abnormal findings: Secondary | ICD-10-CM | POA: Diagnosis not present

## 2018-04-12 DIAGNOSIS — G5762 Lesion of plantar nerve, left lower limb: Secondary | ICD-10-CM | POA: Diagnosis not present

## 2018-05-29 DIAGNOSIS — L820 Inflamed seborrheic keratosis: Secondary | ICD-10-CM | POA: Diagnosis not present

## 2018-05-29 DIAGNOSIS — L814 Other melanin hyperpigmentation: Secondary | ICD-10-CM | POA: Diagnosis not present

## 2018-05-29 DIAGNOSIS — L821 Other seborrheic keratosis: Secondary | ICD-10-CM | POA: Diagnosis not present

## 2018-11-28 DIAGNOSIS — J111 Influenza due to unidentified influenza virus with other respiratory manifestations: Secondary | ICD-10-CM | POA: Diagnosis not present

## 2019-01-31 DIAGNOSIS — Z6837 Body mass index (BMI) 37.0-37.9, adult: Secondary | ICD-10-CM | POA: Diagnosis not present

## 2019-01-31 DIAGNOSIS — Z01419 Encounter for gynecological examination (general) (routine) without abnormal findings: Secondary | ICD-10-CM | POA: Diagnosis not present

## 2019-04-30 ENCOUNTER — Encounter: Payer: Self-pay | Admitting: Nurse Practitioner

## 2019-04-30 ENCOUNTER — Ambulatory Visit (INDEPENDENT_AMBULATORY_CARE_PROVIDER_SITE_OTHER): Payer: BC Managed Care – PPO | Admitting: Nurse Practitioner

## 2019-04-30 ENCOUNTER — Encounter (INDEPENDENT_AMBULATORY_CARE_PROVIDER_SITE_OTHER): Payer: Self-pay

## 2019-04-30 VITALS — BP 118/72 | HR 76 | Temp 98.7°F | Ht 64.0 in | Wt 223.8 lb

## 2019-04-30 DIAGNOSIS — K625 Hemorrhage of anus and rectum: Secondary | ICD-10-CM | POA: Diagnosis not present

## 2019-04-30 DIAGNOSIS — K219 Gastro-esophageal reflux disease without esophagitis: Secondary | ICD-10-CM

## 2019-04-30 DIAGNOSIS — R110 Nausea: Secondary | ICD-10-CM | POA: Diagnosis not present

## 2019-04-30 DIAGNOSIS — R140 Abdominal distension (gaseous): Secondary | ICD-10-CM | POA: Diagnosis not present

## 2019-04-30 DIAGNOSIS — K529 Noninfective gastroenteritis and colitis, unspecified: Secondary | ICD-10-CM

## 2019-04-30 MED ORDER — NA SULFATE-K SULFATE-MG SULF 17.5-3.13-1.6 GM/177ML PO SOLN: ORAL | 0 refills | Status: DC

## 2019-04-30 NOTE — Patient Instructions (Signed)
If you are age 37 or older, your body mass index should be between 23-30. Your Body mass index is 38.42 kg/m. If this is out of the aforementioned range listed, please consider follow up with your Primary Care Provider.  If you are age 10 or younger, your body mass index should be between 19-25. Your Body mass index is 38.42 kg/m. If this is out of the aformentioned range listed, please consider follow up with your Primary Care Provider.   You have been scheduled for an endoscopy and colonoscopy. Please follow the written instructions given to you at your visit today. Please pick up your prep supplies at the pharmacy within the next 1-3 days. If you use inhalers (even only as needed), please bring them with you on the day of your procedure. Your physician has requested that you go to www.startemmi.com and enter the access code given to you at your visit today. This web site gives a general overview about your procedure. However, you should still follow specific instructions given to you by our office regarding your preparation for the procedure.  We have sent the following medications to your pharmacy for you to pick up at your convenience: [place] have been given anti-reflux literature.  See attached.  Thank you for choosing me and [facility].   Tye Savoy, NP   Gastroesophageal Reflux Disease, Adult Gastroesophageal reflux (GER) happens when acid from the stomach flows up into the tube that connects the mouth and the stomach (esophagus). Normally, food travels down the esophagus and stays in the stomach to be digested. However, when a person has GER, food and stomach acid sometimes move back up into the esophagus. If this becomes a more serious problem, the person may be diagnosed with a disease called gastroesophageal reflux disease (GERD). GERD occurs when the reflux:  Happens often.  Causes frequent or severe symptoms.  Causes problems such as damage to the  esophagus. When stomach acid comes in contact with the esophagus, the acid may cause soreness (inflammation) in the esophagus. Over time, GERD may create small holes (ulcers) in the lining of the esophagus. What are the causes? This condition is caused by a problem with the muscle between the esophagus and the stomach (lower esophageal sphincter, or LES). Normally, the LES muscle closes after food passes through the esophagus to the stomach. When the LES is weakened or abnormal, it does not close properly, and that allows food and stomach acid to go back up into the esophagus. The LES can be weakened by certain dietary substances, medicines, and medical conditions, including:  Tobacco use.  Pregnancy.  Having a hiatal hernia.  Alcohol use.  Certain foods and beverages, such as coffee, chocolate, onions, and peppermint. What increases the risk? You are more likely to develop this condition if you:  Have an increased body weight.  Have a connective tissue disorder.  Use NSAID medicines. What are the signs or symptoms? Symptoms of this condition include:  Heartburn.  Difficult or painful swallowing.  The feeling of having a lump in the throat.  Abitter taste in the mouth.  Bad breath.  Having a large amount of saliva.  Having an upset or bloated stomach.  Belching.  Chest pain. Different conditions can cause chest pain. Make sure you see your health care provider if you experience chest pain.  Shortness of breath or wheezing.  Ongoing (chronic) cough or a night-time cough.  Wearing away of tooth enamel.  Weight loss. How is this  diagnosed? Your health care provider will take a medical history and perform a physical exam. To determine if you have mild or severe GERD, your health care provider may also monitor how you respond to treatment. You may also have tests, including:  A test to examine your stomach and esophagus with a small camera (endoscopy).  A test  thatmeasures the acidity level in your esophagus.  A test thatmeasures how much pressure is on your esophagus.  A barium swallow or modified barium swallow test to show the shape, size, and functioning of your esophagus. How is this treated? The goal of treatment is to help relieve your symptoms and to prevent complications. Treatment for this condition may vary depending on how severe your symptoms are. Your health care provider may recommend:  Changes to your diet.  Medicine.  Surgery. Follow these instructions at home: Eating and drinking   Follow a diet as recommended by your health care provider. This may involve avoiding foods and drinks such as: ? Coffee and tea (with or without caffeine). ? Drinks that containalcohol. ? Energy drinks and sports drinks. ? Carbonated drinks or sodas. ? Chocolate and cocoa. ? Peppermint and mint flavorings. ? Garlic and onions. ? Horseradish. ? Spicy and acidic foods, including peppers, chili powder, curry powder, vinegar, hot sauces, and barbecue sauce. ? Citrus fruit juices and citrus fruits, such as oranges, lemons, and limes. ? Tomato-based foods, such as red sauce, chili, salsa, and pizza with red sauce. ? Fried and fatty foods, such as donuts, french fries, potato chips, and high-fat dressings. ? High-fat meats, such as hot dogs and fatty cuts of red and white meats, such as rib eye steak, sausage, ham, and bacon. ? High-fat dairy items, such as whole milk, butter, and cream cheese.  Eat small, frequent meals instead of large meals.  Avoid drinking large amounts of liquid with your meals.  Avoid eating meals during the 2-3 hours before bedtime.  Avoid lying down right after you eat.  Do not exercise right after you eat. Lifestyle   Do not use any products that contain nicotine or tobacco, such as cigarettes, e-cigarettes, and chewing tobacco. If you need help quitting, ask your health care provider.  Try to reduce your  stress by using methods such as yoga or meditation. If you need help reducing stress, ask your health care provider.  If you are overweight, reduce your weight to an amount that is healthy for you. Ask your health care provider for guidance about a safe weight loss goal. General instructions  Pay attention to any changes in your symptoms.  Take over-the-counter and prescription medicines only as told by your health care provider. Do not take aspirin, ibuprofen, or other NSAIDs unless your health care provider told you to do so.  Wear loose-fitting clothing. Do not wear anything tight around your waist that causes pressure on your abdomen.  Raise (elevate) the head of your bed about 6 inches (15 cm).  Avoid bending over if this makes your symptoms worse.  Keep all follow-up visits as told by your health care provider. This is important. Contact a health care provider if:  You have: ? New symptoms. ? Unexplained weight loss. ? Difficulty swallowing or it hurts to swallow. ? Wheezing or a persistent cough. ? A hoarse voice.  Your symptoms do not improve with treatment. Get help right away if you:  Have pain in your arms, neck, jaw, teeth, or back.  Feel sweaty, dizzy, or light-headed.  Have chest pain or shortness of breath.  Vomit and your vomit looks like blood or coffee grounds.  Faint.  Have stool that is bloody or black.  Cannot swallow, drink, or eat. Summary  Gastroesophageal reflux happens when acid from the stomach flows up into the esophagus. GERD is a disease in which the reflux happens often, causes frequent or severe symptoms, or causes problems such as damage to the esophagus.  Treatment for this condition may vary depending on how severe your symptoms are. Your health care provider may recommend diet and lifestyle changes, medicine, or surgery.  Contact a health care provider if you have new or worsening symptoms.  Take over-the-counter and prescription  medicines only as told by your health care provider. Do not take aspirin, ibuprofen, or other NSAIDs unless your health care provider told you to do so.  Keep all follow-up visits as told by your health care provider. This is important. This information is not intended to replace advice given to you by your health care provider. Make sure you discuss any questions you have with your health care provider. Document Released: 08/04/2005 Document Revised: 05/03/2018 Document Reviewed: 05/03/2018 [employer] Interactive Patient Education  2019 [employer]

## 2019-04-30 NOTE — Progress Notes (Addendum)
ASSESSMENT / PLAN:   52.  38 year old female with chronic but progressive postprandial diarrhea, bloating, and nausea.  She has occasional rectal bleeding but attributes it to perianal irritation secondary to diarrhea.   Evaluated by [facility] one year ago for same symptoms. Records requested though no endoscopic work-up was performed. -Since her symptoms are progressive we will proceed with endoscopic work-up.  Patient will be scheduled for EGD and colonoscopy, possibly with random biopsies. The risks and benefits of colonoscopy with possible polypectomy / biopsies were discussed and the patient agrees to proceed.  -If endoscopic work-up negative consider trial of Xifaxan for possible SIBO causing the bloating and diarrhea  2. GERD, mainly following dinner. Fairly recent onset -Symptoms occur 1-2 times a week, at this point will hold off on starting antireflux medications.  We discussed antireflux measures such as weight loss, minimizing caffeine, etc. Written literature given   05/07/19 Records received from [facility]. She was evaluated there May 2018 for diffuse lower abdominal pain, alternating bowel habits, nausea, vomiting and GERD. Felt to have IBS. Labs normal. CTAP w/ contrast 03/01/17 was unremarkable. She was prescribed Amitiza  HPI:    Chief Complaint:   Worsening of chronic postprandial diarrhea, bloating, nausea, and GERD  Patient is a 38 year old female with history of chronic headaches, anxiety, and depression. Approximately 1.5- 2 years ago patient developed problems with diarrhea, bloating and nausea. Prior to then she tended towards constipation.  The majority of her symptoms are postprandial.  She saw [facility] approximately one year ago, records have been requested but it does not sound like she had any testing done.  Her symptoms have gradually gotten worse.  Difficult to attend meetings without having to run to the bathroom because of diarrhea.  She kept a food  diary to try and determine trigger foods but there was no correlation with any certain type of food.  She tried minimizing caffeine, avoiding gluten, avoiding dairy but it made no difference.  Though she does not know which foods trigger the diarrhea she is clear that it is associated with eating. She does have occasional rectal bleeding but attributes this to perianal irritation from diarrhea.  She has no family history of inflammatory bowel disease.  She has had no weight loss,  complains of weight gain.  The postprandial nausea without vomiting is chronic but it also has gotten progressively worse.  A month or so ago patient starting have burning in her chest 1-2 times a week,  usually after dinner in an upright position.  She has no nocturnal symptoms.   Data Reviewed:  April 2018 -CTAP with contrast - evaluation of  lower abdominal pain, nausea and vomiting. Negative study.    Past Medical History:  Diagnosis Date   Anxiety    C/S 06/09/12 M 06/10/2012   Chronic headaches    Complication of anesthesia    severe itching p.o CSx2  & knee surgery   Depression    h/o pp depression   Heartburn in pregnancy    Postpartum care following cesarean delivery 06/11/2012     Past Surgical History:  Procedure Laterality Date   CESAREAN SECTION     x 3   CESAREAN SECTION  06/09/2012   Procedure: CESAREAN SECTION;  Surgeon: Lovenia Kim, MD;  Location: [facility];  Service: Gynecology;  Laterality: N/A;  EDD: 06/15/12, repeat   KNEE ARTHROSCOPY Left 2011  TONSILLECTOMY     Family History  Problem Relation Age of Onset   Bipolar disorder Mother    Hypothyroidism Mother    Hypertension Father    Cirrhosis Father        alcohol related   Colon cancer Maternal Grandmother        in her early 6760s   Colon polyps Maternal Grandmother    Diabetes Maternal Grandmother    Cirrhosis Paternal Grandmother    Other Neg Hx    Social History   Tobacco Use   Smoking status: Never  Smoker   Smokeless tobacco: Never Used  Substance Use Topics   Alcohol use: No   Drug use: No   Current Outpatient Medications  Medication Sig Dispense Refill   norgestimate-ethinyl estradiol (ORTHO-CYCLEN) 0.25-35 MG-MCG tablet Take 1 tablet by mouth daily.     No current facility-administered medications for this visit.    Allergies  Allergen Reactions   Oxycodone Itching    Particularly severe itching not responding to oral Benadryl.  Hydrocodone seems to be OK   Latex Rash     Review of Systems: Positive for anxiety, depression, fatigue, headaches and sleeping problems.  All systems reviewed and negative except where noted in HPI.   Creatinine clearance cannot be calculated (Patient's most recent lab result is older than the maximum 21 days allowed.)   Physical Exam:    Wt Readings from Last 3 Encounters:  04/30/19 223 lb 12.8 oz (101.5 kg)  11/26/16 216 lb 9.6 oz (98.2 kg)  08/05/16 208 lb (94.3 kg)    BP 118/72    Pulse 76    Temp 98.7 F (37.1 C)    Ht 5\' 4"  (1.626 m)    Wt 223 lb 12.8 oz (101.5 kg)    BMI 38.42 kg/m  Constitutional:  Pleasant female in no acute distress. Psychiatric: Normal mood and affect. Behavior is normal. EENT: Pupils normal.  Conjunctivae are normal. No scleral icterus. Neck supple.  Cardiovascular: Normal rate, regular rhythm. No edema Pulmonary/chest: Effort normal and breath sounds normal. No wheezing, rales or rhonchi. Abdominal: Soft, nondistended, nontender. Bowel sounds active throughout. There are no masses palpable. No hepatomegaly. Neurological: Alert and oriented to person place and time. Skin: Skin is warm and dry. No rashes noted.  Willette ClusterPaula Zyaire Mccleod, NP  04/30/2019, 9:25 AM

## 2019-05-07 ENCOUNTER — Telehealth: Payer: Self-pay | Admitting: Gastroenterology

## 2019-05-07 NOTE — Telephone Encounter (Signed)
Called and spoke to pt. She would like to try the "antibiotic" mentioned during her visit last week instead of during an ECL, which she cancelled.  Would you like to send a script for Xifaxan TID for 14 days? Please advise

## 2019-05-07 NOTE — Telephone Encounter (Signed)
Beth, will you please help patient get Xifaxan 550mg  TID x 14 days. Thanks

## 2019-05-08 ENCOUNTER — Other Ambulatory Visit: Payer: Self-pay | Admitting: *Deleted

## 2019-05-08 MED ORDER — RIFAXIMIN 550 MG PO TABS: 550.0000 mg | ORAL_TABLET | Freq: Three times a day (TID) | ORAL | 0 refills | Status: AC

## 2019-05-08 NOTE — Telephone Encounter (Signed)
Prescription has been sent to pharmacy, patient notified.

## 2019-05-08 NOTE — Progress Notes (Signed)
Entered in error

## 2019-05-17 ENCOUNTER — Telehealth: Payer: Self-pay | Admitting: Nurse Practitioner

## 2019-05-17 ENCOUNTER — Other Ambulatory Visit: Payer: Self-pay

## 2019-05-17 MED ORDER — METRONIDAZOLE 500 MG PO TABS: 500.0000 mg | ORAL_TABLET | Freq: Three times a day (TID) | ORAL | 0 refills | Status: DC

## 2019-05-17 NOTE — Telephone Encounter (Signed)
Patient called said that the med rifaximin Doreene Nest) 550 is to expensive. Her insurance covers it but her out of pocket is to high. Would like something cheaper.

## 2019-05-17 NOTE — Telephone Encounter (Signed)
Flagyl 500 mg TID x 10 days.  May cause nausea but I do not know of an alternative. Thanks

## 2019-05-17 NOTE — Progress Notes (Signed)
New Rx sent to her pharmacy. Attempted to contact the patient. Voicemail is full and cannot accept messages.

## 2019-05-17 NOTE — Telephone Encounter (Signed)
Rx sent to the patient's pharmacy. Attempted to contact her. Voicemail is full and cannot accept new messages.

## 2019-05-17 NOTE — Telephone Encounter (Signed)
Joanne Mcguire are there other options?

## 2019-05-17 NOTE — Telephone Encounter (Signed)
Please advise 

## 2019-05-18 ENCOUNTER — Encounter: Payer: BC Managed Care – PPO | Admitting: Gastroenterology

## 2019-05-21 NOTE — Telephone Encounter (Signed)
Patient is aware. Picked up her Rx on Saturday and started it yesterday. She will contact us if she acutely worsens or she fails to improve.

## 2019-11-19 DIAGNOSIS — Z3200 Encounter for pregnancy test, result unknown: Secondary | ICD-10-CM | POA: Diagnosis not present

## 2019-11-19 DIAGNOSIS — Z3689 Encounter for other specified antenatal screening: Secondary | ICD-10-CM | POA: Diagnosis not present

## 2019-12-17 DIAGNOSIS — Z3201 Encounter for pregnancy test, result positive: Secondary | ICD-10-CM | POA: Diagnosis not present

## 2019-12-18 ENCOUNTER — Other Ambulatory Visit: Payer: Self-pay | Admitting: Obstetrics and Gynecology

## 2019-12-28 DIAGNOSIS — O09521 Supervision of elderly multigravida, first trimester: Secondary | ICD-10-CM | POA: Diagnosis not present

## 2019-12-28 DIAGNOSIS — Z3689 Encounter for other specified antenatal screening: Secondary | ICD-10-CM | POA: Diagnosis not present

## 2019-12-28 DIAGNOSIS — Z3A09 9 weeks gestation of pregnancy: Secondary | ICD-10-CM | POA: Diagnosis not present

## 2019-12-29 ENCOUNTER — Emergency Department (HOSPITAL_COMMUNITY): Payer: BC Managed Care – PPO

## 2019-12-29 ENCOUNTER — Emergency Department (HOSPITAL_COMMUNITY)
Admission: EM | Admit: 2019-12-29 | Discharge: 2019-12-29 | Disposition: A | Discharge status: Home / Self Care | Payer: BC Managed Care – PPO | Attending: Emergency Medicine | Admitting: Emergency Medicine

## 2019-12-29 ENCOUNTER — Other Ambulatory Visit: Payer: Self-pay

## 2019-12-29 ENCOUNTER — Encounter (HOSPITAL_COMMUNITY): Payer: Self-pay | Admitting: Emergency Medicine

## 2019-12-29 DIAGNOSIS — O26811 Pregnancy related exhaustion and fatigue, first trimester: Secondary | ICD-10-CM | POA: Diagnosis not present

## 2019-12-29 DIAGNOSIS — R002 Palpitations: Secondary | ICD-10-CM | POA: Diagnosis not present

## 2019-12-29 DIAGNOSIS — O99411 Diseases of the circulatory system complicating pregnancy, first trimester: Secondary | ICD-10-CM | POA: Diagnosis not present

## 2019-12-29 DIAGNOSIS — Z3A10 10 weeks gestation of pregnancy: Secondary | ICD-10-CM | POA: Diagnosis not present

## 2019-12-29 DIAGNOSIS — R0602 Shortness of breath: Secondary | ICD-10-CM | POA: Diagnosis not present

## 2019-12-29 DIAGNOSIS — R000 Tachycardia, unspecified: Secondary | ICD-10-CM | POA: Diagnosis not present

## 2019-12-29 DIAGNOSIS — I493 Ventricular premature depolarization: Secondary | ICD-10-CM | POA: Insufficient documentation

## 2019-12-29 LAB — CBC
HCT: 41.7 % (ref 36.0–46.0)
Hemoglobin: 13.9 g/dL (ref 12.0–15.0)
MCH: 31.7 pg (ref 26.0–34.0)
MCHC: 33.3 g/dL (ref 30.0–36.0)
MCV: 95 fL (ref 80.0–100.0)
Platelets: 245 10*3/uL (ref 150–400)
RBC: 4.39 MIL/uL (ref 3.87–5.11)
RDW: 12.4 % (ref 11.5–15.5)
WBC: 10.3 10*3/uL (ref 4.0–10.5)
nRBC: 0 % (ref 0.0–0.2)

## 2019-12-29 LAB — BASIC METABOLIC PANEL
Anion gap: 11 (ref 5–15)
BUN: 5 mg/dL — ABNORMAL LOW (ref 6–20)
CO2: 20 mmol/L — ABNORMAL LOW (ref 22–32)
Calcium: 9.4 mg/dL (ref 8.9–10.3)
Chloride: 108 mmol/L (ref 98–111)
Creatinine, Ser: 0.78 mg/dL (ref 0.44–1.00)
GFR calc Af Amer: >60 mL/min (ref >60)
GFR calc non Af Amer: >60 mL/min (ref >60)
Glucose, Bld: 91 mg/dL (ref 70–99)
Potassium: 3.5 mmol/L (ref 3.5–5.1)
Sodium: 139 mmol/L (ref 135–145)

## 2019-12-29 LAB — TROPONIN I (HIGH SENSITIVITY): Troponin I (High Sensitivity): <2 ng/L (ref <18)

## 2019-12-29 LAB — TSH: TSH: 1.184 u[IU]/mL (ref 0.350–4.500)

## 2019-12-29 LAB — D-DIMER, QUANTITATIVE: D-Dimer, Quant: 0.34 ug/mL-FEU (ref 0.00–0.50)

## 2019-12-29 MED ORDER — SODIUM CHLORIDE 0.9% FLUSH: 3.0000 mL | Freq: Once | INTRAVENOUS | Status: DC

## 2019-12-29 MED ORDER — SODIUM CHLORIDE 0.9 % IV BOLUS
1000.0000 mL | Freq: Once | INTRAVENOUS | Status: AC
  Administered 2019-12-29: 1000 mL via INTRAVENOUS

## 2019-12-29 NOTE — ED Triage Notes (Signed)
Pt states she is [redacted] weeks pregnant.  Reports heart fluttering and SOB with exertion x 2 months.  States at night she starts gasping and coughing.

## 2019-12-29 NOTE — ED Provider Notes (Signed)
[facility] Emergency Department Provider Note MRN:  [id number]  Arrival date & time: 12/29/19     Chief Complaint   heart fluttering   History of Present Illness   Joanne Mcguire is a 39 y.o. year-old female with no pertinent past medical history presenting to the ED with chief complaint of heart fluttering.  Patient has been experiencing intermittent palpitations for the past few months.  She explains that ever since she became pregnant, these palpitations have been more persistent and more frequent.  She is also experiencing a shortness of breath sensation that is worse at night when laying flat, which triggers her to take a deep breath and/or cough.  Has been told it could be related to GERD, tried Pepcid for over a week and it did not help.  She denies chest pain, no leg pain or swelling, no abdominal pain, no vaginal bleeding, no leakage of fluid.  Review of Systems  A complete 10 system review of systems was obtained and all systems are negative except as noted in the HPI and PMH.   Patient's Health History    Past Medical History:  Diagnosis Date  . Anxiety   . C/S 06/09/12 M 06/10/2012  . Chronic headaches   . Complication of anesthesia    severe itching p.o CSx2  & knee surgery  . Depression    h/o pp depression  . Heartburn in pregnancy   . Postpartum care following cesarean delivery 06/11/2012    Past Surgical History:  Procedure Laterality Date  . CESAREAN SECTION     x 3  . CESAREAN SECTION  06/09/2012   Procedure: CESAREAN SECTION;  Surgeon: Lovenia Kim, MD;  Location: [facility];  Service: Gynecology;  Laterality: N/A;  EDD: 06/15/12, repeat  . KNEE ARTHROSCOPY Left 2011  . TONSILLECTOMY      Family History  Problem Relation Age of Onset  . Bipolar disorder Mother   . Hypothyroidism Mother   . Hypertension Father   . Cirrhosis Father        alcohol related  . Colon cancer Maternal Grandmother        in her early 80s  . Colon polyps  Maternal Grandmother   . Diabetes Maternal Grandmother   . Cirrhosis Paternal Grandmother   . Other Neg Hx     Social History   Socioeconomic History  . Marital status: Legally Separated    Spouse name: Not on file  . Number of children: Not on file  . Years of education: Not on file  . Highest education level: Not on file  Occupational History  . Not on file  Tobacco Use  . Smoking status: Never Smoker  . Smokeless tobacco: Never Used  Substance and Sexual Activity  . Alcohol use: No  . Drug use: No  . Sexual activity: Not on file  Other Topics Concern  . Not on file  Social History Narrative  . Not on file   Social Determinants of Health   Financial Resource Strain:   . Difficulty of Paying Living Expenses: Not on file  Food Insecurity:   . Worried About Charity fundraiser in the Last Year: Not on file  . Ran Out of Food in the Last Year: Not on file  Transportation Needs:   . Lack of Transportation (Medical): Not on file  . Lack of Transportation (Non-Medical): Not on file  Physical Activity:   . Days of Exercise per Week: Not on file  .  Minutes of Exercise per Session: Not on file  Stress:   . Feeling of Stress : Not on file  Social Connections:   . Frequency of Communication with Friends and Family: Not on file  . Frequency of Social Gatherings with Friends and Family: Not on file  . Attends Religious Services: Not on file  . Active Member of Clubs or Organizations: Not on file  . Attends Banker Meetings: Not on file  . Marital Status: Not on file  Intimate Partner Violence:   . Fear of Current or Ex-Partner: Not on file  . Emotionally Abused: Not on file  . Physically Abused: Not on file  . Sexually Abused: Not on file     Physical Exam   Vitals:   12/29/19 1715 12/29/19 1745  BP: (!) 102/58 (!) 99/50  Pulse: (!) 106 98  Resp: 13 18  Temp:    SpO2: 99% 99%    CONSTITUTIONAL: Well-appearing, NAD NEURO:  Alert and oriented x 3, no  focal deficits EYES:  eyes equal and reactive ENT/NECK:  no LAD, no JVD CARDIO: Regular rate, well-perfused, normal S1 and S2 PULM:  CTAB no wheezing or rhonchi GI/GU:  normal bowel sounds, non-distended, non-tender MSK/SPINE:  No gross deformities, no edema SKIN:  no rash, atraumatic PSYCH:  Appropriate speech and behavior  *Additional and/or pertinent findings included in MDM below  Diagnostic and Interventional Summary    EKG Interpretation  Date/Time:  Saturday December 29 2019 15:24:43 EST Ventricular Rate:  125 PR Interval:  122 QRS Duration: 84 QT Interval:  314 QTC Calculation: 453 R Axis:   99 Text Interpretation: Sinus tachycardia Rightward axis Borderline ECG Confirmed by Kennis Carina [id number]) on 12/29/2019 3:43:13 PM      Cardiac Monitoring Interpretation: Cardiac monitoring was ordered to monitor the patient for dysrhythmia.  I personally interpreted the patient's cardiac monitor while at the bedside. 12/29/2019 6:26 PM sinus rhythm with rates varying from 80-115.  Also noted are occasional PVCs.  Labs Reviewed  BASIC METABOLIC PANEL - Abnormal; Notable for the following components:      Result Value   CO2 20 (*)    BUN 5 (*)    All other components within normal limits  CBC  D-DIMER, QUANTITATIVE (NOT AT [facility])  TSH  TROPONIN I (HIGH SENSITIVITY)    DG Chest 2 View  Final Result      Medications  sodium chloride flush (NS) 0.9 % injection 3 mL (has no administration in time range)  sodium chloride 0.9 % bolus 1,000 mL (1,000 mLs Intravenous New Bag/Given 12/29/19 1603)     Procedures  /  Critical Care Procedures  ED Course and Medical Decision Making  I have reviewed the triage vital signs, the nursing notes, and pertinent available records from the EMR.  Pertinent labs & imaging results that were available during my care of the patient were reviewed by me and considered in my medical decision making (see below for details).     Patient is [redacted]  weeks pregnant and arrives tachycardic between 110 and 125 on my evaluation.  She has no evidence of DVT on exam, her abdomen is soft and nontender, and she does not appear short of breath.  In general her description of symptoms is much more likely to be related to GERD, however PE needs to be considered given her hypercoagulable state in pregnancy.  Given the low risk, will follow years algorithm and obtain D-dimer.  If less than 1000 ng/mL,  PE would be excluded based on the literature.  6:27 PM update: Patient's D-dimer is negative.  X-ray normal, labs reassuring.  Patient was placed on cardiac monitor and observed for a few hours here in the emergency department, demonstrating sinus rhythm with intermittent sinus tachycardia as well as occasional PVCs.  Suspect this is related to fluid shifts in the setting of early pregnancy with possibly dehydration contributing as well.  There is no evidence of emergent etiology today, she is appropriate for outpatient follow-up.  Referred to cardiology.  Elmer Sow. Pilar Plate, MD [facility] Emergency Medicine [facility] [email or web address]  Final Clinical Impressions(s) / ED Diagnoses     ICD-10-CM   1. PVC's (premature ventricular contractions)  I49.3   2. Palpitation  R00.2 DG Chest 2 View    DG Chest 2 View    ED Discharge Orders         Ordered    Ambulatory referral to Cardiology     12/29/19 1826           Discharge Instructions Discussed with and Provided to Patient:     Discharge Instructions     You were evaluated in the Emergency Department and after careful evaluation, we did not find any emergent condition requiring admission or further testing in the hospital.  Your exam/testing today is overall reassuring.  We recommend follow-up with the cardiologist as well as continue follow-up with your [facility] doctor.  Please return to the Emergency Department if you experience any worsening of your condition.  We encourage you to  follow up with a primary care provider.  Thank you for allowing [place] to be a part of your care.       Sabas Sous, MD 12/29/19 [phone]

## 2019-12-29 NOTE — ED Notes (Signed)
Pt d/c home per MD order. Discharge summary reviewed, pt verbalizes understanding. No s/s of distress noted.

## 2019-12-29 NOTE — Discharge Instructions (Addendum)
You were evaluated in the Emergency Department and after careful evaluation, we did not find any emergent condition requiring admission or further testing in the hospital.  Your exam/testing today is overall reassuring.  We recommend follow-up with the cardiologist as well as continue follow-up with your [facility] doctor.  Please return to the Emergency Department if you experience any worsening of your condition.  We encourage you to follow up with a primary care provider.  Thank you for allowing [place] to be a part of your care.

## 2020-01-04 DIAGNOSIS — Z3A10 10 weeks gestation of pregnancy: Secondary | ICD-10-CM | POA: Diagnosis not present

## 2020-01-04 DIAGNOSIS — Z113 Encounter for screening for infections with a predominantly sexual mode of transmission: Secondary | ICD-10-CM | POA: Diagnosis not present

## 2020-01-04 DIAGNOSIS — O09521 Supervision of elderly multigravida, first trimester: Secondary | ICD-10-CM | POA: Diagnosis not present

## 2020-01-04 DIAGNOSIS — Z36 Encounter for antenatal screening for chromosomal anomalies: Secondary | ICD-10-CM | POA: Diagnosis not present

## 2020-01-09 ENCOUNTER — Encounter: Payer: Self-pay | Admitting: Cardiology

## 2020-01-09 NOTE — Progress Notes (Signed)
Cardiology Office Note  Date: 01/10/2020   ID: SHAWNTEE MAINWARING, DOB [birth year], MRN [id number]  PCP:  Donald Prose, MD  Consulting Cardiologist: Satira Sark, MD Electrophysiologist:  None   Chief Complaint  Patient presents with  . Palpitations    History of Present Illness: Joanne Mcguire is a 39 y.o. female referred for cardiology consultation by Dr. Sedonia Small for evaluation of palpitations.  She was seen in the ER on February 20, I reviewed the records.  She states that she just "does not feel right," symptoms started back in November 2020 prior to becoming pregnant.  She describes a fluttering, sometimes skipping sensation in her chest and also fatigue.  She has not had any chest pain or syncope.  Work-up in the ER included chest x-ray that showed no acute findings, D-dimer negative, normal hemoglobin, normal high-sensitivity troponin I, and normal TSH.  She was observed on telemetry with sinus rhythm and sinus tachycardia, occasional PVCs documented.   She states that she is hydrating well.  She also follows regularly with an obstetrician.  No significant caffeine or other stimulant intake.  She is not taking any prescription medications.  She states that this is her fourth pregnancy.  No prior complications in terms of cardiomyopathy or dysrhythmia.  Follow-up ECG today shows sinus tachycardia with occasional PVCs, upright in the inferior leads.  She does not report any prior history of cardiac arrhythmia, no family history of premature cardiovascular disease or sudden cardiac death.  Past Medical History:  Diagnosis Date  . Anxiety   . Chronic headaches   . Complication of anesthesia    Reportedly severe itching following cesarean section and prior knee surgery  . Heartburn in pregnancy   . History of cesarean section 06/10/2012  . Postpartum depression     Past Surgical History:  Procedure Laterality Date  . CESAREAN SECTION     x 3  . CESAREAN SECTION  06/09/2012   Procedure: CESAREAN SECTION;  Surgeon: Lovenia Kim, MD;  Location: [facility];  Service: Gynecology;  Laterality: N/A;  EDD: 06/15/12, repeat  . KNEE ARTHROSCOPY Left 2011  . TONSILLECTOMY      Medications: No prescription medications  Allergies:  Oxycodone and Latex   Social History: The patient  reports that she has never smoked. She has never used smokeless tobacco. She reports that she does not drink alcohol or use drugs.   Family History: The patient's family history includes Bipolar disorder in her mother; Cirrhosis in her father and paternal grandmother; Colon cancer in her maternal grandmother; Colon polyps in her maternal grandmother; Diabetes in her maternal grandmother; Hypertension in her father; Hypothyroidism in her mother.   ROS:   No unexplained dizziness or syncope.  Physical Exam: VS:  BP 120/74   Pulse 73   Temp 98.8 F (37.1 C)   Ht 5\' 3"  (1.6 m)   Wt 222 lb (100.7 kg)   SpO2 98%   BMI 39.33 kg/m , BMI Body mass index is 39.33 kg/m.  Wt Readings from Last 3 Encounters:  01/10/20 222 lb (100.7 kg)  04/30/19 223 lb 12.8 oz (101.5 kg)  11/26/16 216 lb 9.6 oz (98.2 kg)    General: Patient appears comfortable at rest. HEENT: Conjunctiva and lids normal, wearing a mask. Neck: Supple, no elevated JVP or carotid bruits, no thyromegaly. Lungs: Clear to auscultation, nonlabored breathing at rest. Cardiac: Regular rate and rhythm with ectopy, no S3, soft systolic murmur, no pericardial rub. Abdomen: Soft,  nontender, bowel sounds present. Extremities: No pitting edema, distal pulses 2+. Skin: Warm and dry. Musculoskeletal: No kyphosis. Neuropsychiatric: Alert and oriented x3, affect grossly appropriate.  ECG:  An ECG dated 12/29/2019 was personally reviewed today and demonstrated:  Sinus tachycardia with rightward axis.  Recent Labwork: 12/29/2019: BUN 5; Creatinine, Ser 0.78; Hemoglobin 13.9; Platelets 245; Potassium 3.5; Sodium 139; TSH 1.184   Other Studies  Reviewed Today:  Chest x-ray 12/29/2019: FINDINGS: Lungs are clear.  No pleural effusion or pneumothorax.  The heart is normal in size.  Visualized osseous structures are within normal limits.  IMPRESSION: Normal chest radiographs.  Assessment and Plan:  Palpitations as outlined above, started back in November 2020 prior to pregnancy and now more frequent.  She has had no associated syncope but feels fatigued.  Blood pressure is normal.  Recent ER work-up reviewed with overall reassuring findings.  She does have PVCs documented which are potentially benign and from the outflow tract, upright in the inferior leads.  We will obtain an echocardiogram to exclude cardiomyopathy and also a cardiac monitor to quantify PVC frequency.  I would generally avoid medications to suppress heart rate and PVCs in the setting of pregnancy unless absolutely necessary.  If she has an unusually high PVC burden, I will refer her for EP consultation to at least discuss the situation.  Medication Adjustments/Labs and Tests Ordered: Current medicines are reviewed at length with the patient today.  Concerns regarding medicines are outlined above.   Tests Ordered: Orders Placed This Encounter  Procedures  . Holter monitor - 72 hour  . EKG 12-Lead  . ECHOCARDIOGRAM COMPLETE    Medication Changes: No orders of the defined types were placed in this encounter.   Disposition:  Follow up test results.  Signed, Jonelle Sidle, MD, [facility] 01/10/2020 4:43 PM    [facility] [address], [place], [place] [address] Phone: [phone]; Fax: [phone]

## 2020-01-10 ENCOUNTER — Encounter: Payer: Self-pay | Admitting: Cardiology

## 2020-01-10 ENCOUNTER — Other Ambulatory Visit: Payer: Self-pay

## 2020-01-10 ENCOUNTER — Ambulatory Visit (INDEPENDENT_AMBULATORY_CARE_PROVIDER_SITE_OTHER): Payer: BC Managed Care – PPO | Admitting: Cardiology

## 2020-01-10 ENCOUNTER — Ambulatory Visit: Payer: BC Managed Care – PPO

## 2020-01-10 VITALS — BP 120/74 | HR 73 | Temp 98.8°F | Ht 63.0 in | Wt 222.0 lb

## 2020-01-10 DIAGNOSIS — I493 Ventricular premature depolarization: Secondary | ICD-10-CM

## 2020-01-10 DIAGNOSIS — R002 Palpitations: Secondary | ICD-10-CM

## 2020-01-10 NOTE — Patient Instructions (Signed)
Medication Instructions:  Your physician recommends that you continue on your current medications as directed. Please refer to the Current Medication list given to you today.  *If you need a refill on your cardiac medications before your next appointment, please call your pharmacy*   Lab Work: None today  If you have labs (blood work) drawn today and your tests are completely normal, you will receive your results only by: Marland Kitchen MyChart Message (if you have MyChart) OR . A paper copy in the mail If you have any lab test that is abnormal or we need to change your treatment, we will call you to review the results.   Testing/Procedures: Your physician has requested that you have an echocardiogram. Echocardiography is a painless test that uses sound waves to create images of your heart. It provides your doctor with information about the size and shape of your heart and how well your heart's chambers and valves are working. This procedure takes approximately one hour. There are no restrictions for this procedure.   Your physician has recommended that you wear a holter monitor for 72 hours . Holter monitors are medical devices that record the heart's electrical activity. Doctors most often use these monitors to diagnose arrhythmias. Arrhythmias are problems with the speed or rhythm of the heartbeat. The monitor is a small, portable device. You can wear one while you do your normal daily activities. This is usually used to diagnose what is causing palpitations/syncope (passing out).     Follow-Up: At [facility], you and your health needs are our priority.  As part of our continuing mission to provide you with exceptional heart care, we have created designated Provider Care Teams.  These Care Teams include your primary Cardiologist (physician) and Advanced Practice Providers (APPs -  Physician Assistants and Nurse Practitioners) who all work together to provide you with the care you need, when you  need it.  We recommend signing up for the patient portal called "MyChart".  Sign up information is provided on this After Visit Summary.  MyChart is used to connect with patients for Virtual Visits (Telemedicine).  Patients are able to view lab/test results, encounter notes, upcoming appointments, etc.  Non-urgent messages can be sent to your provider as well.   To learn more about what you can do with MyChart, go to [email or web address].    Your next appointment:  We will call with results of test. Follow up will be based on test results.     Thank you for choosing [facility] !

## 2020-01-16 ENCOUNTER — Ambulatory Visit (HOSPITAL_COMMUNITY)
Admission: RE | Admit: 2020-01-16 | Discharge: 2020-01-16 | Disposition: A | Discharge status: Home / Self Care | Payer: BC Managed Care – PPO | Source: Ambulatory Visit | Attending: Cardiology | Admitting: Cardiology

## 2020-01-16 ENCOUNTER — Other Ambulatory Visit: Payer: Self-pay

## 2020-01-16 DIAGNOSIS — I493 Ventricular premature depolarization: Secondary | ICD-10-CM

## 2020-01-16 DIAGNOSIS — R002 Palpitations: Secondary | ICD-10-CM

## 2020-01-16 NOTE — Progress Notes (Signed)
*  PRELIMINARY RESULTS* Echocardiogram 2D Echocardiogram has been performed.  Stacey Drain 01/16/2020, 3:53 PM

## 2020-01-21 DIAGNOSIS — R002 Palpitations: Secondary | ICD-10-CM | POA: Diagnosis not present

## 2020-01-22 ENCOUNTER — Other Ambulatory Visit: Payer: Self-pay

## 2020-01-25 DIAGNOSIS — Z3A13 13 weeks gestation of pregnancy: Secondary | ICD-10-CM | POA: Diagnosis not present

## 2020-01-25 DIAGNOSIS — O09521 Supervision of elderly multigravida, first trimester: Secondary | ICD-10-CM | POA: Diagnosis not present

## 2020-02-19 DIAGNOSIS — O09522 Supervision of elderly multigravida, second trimester: Secondary | ICD-10-CM | POA: Diagnosis not present

## 2020-02-19 DIAGNOSIS — Z3A17 17 weeks gestation of pregnancy: Secondary | ICD-10-CM | POA: Diagnosis not present

## 2020-02-19 DIAGNOSIS — Z361 Encounter for antenatal screening for raised alphafetoprotein level: Secondary | ICD-10-CM | POA: Diagnosis not present

## 2020-03-03 ENCOUNTER — Encounter (HOSPITAL_COMMUNITY): Payer: Self-pay | Admitting: Obstetrics

## 2020-03-03 ENCOUNTER — Inpatient Hospital Stay (HOSPITAL_COMMUNITY)
Admission: AD | Admit: 2020-03-03 | Discharge: 2020-03-03 | Disposition: A | Discharge status: Home / Self Care | Payer: BC Managed Care – PPO | Attending: Obstetrics | Admitting: Obstetrics

## 2020-03-03 ENCOUNTER — Other Ambulatory Visit: Payer: Self-pay

## 2020-03-03 DIAGNOSIS — R030 Elevated blood-pressure reading, without diagnosis of hypertension: Secondary | ICD-10-CM

## 2020-03-03 DIAGNOSIS — B373 Candidiasis of vulva and vagina: Secondary | ICD-10-CM | POA: Diagnosis not present

## 2020-03-03 DIAGNOSIS — Z3A19 19 weeks gestation of pregnancy: Secondary | ICD-10-CM | POA: Insufficient documentation

## 2020-03-03 DIAGNOSIS — O26892 Other specified pregnancy related conditions, second trimester: Secondary | ICD-10-CM | POA: Diagnosis not present

## 2020-03-03 DIAGNOSIS — Z885 Allergy status to narcotic agent status: Secondary | ICD-10-CM | POA: Diagnosis not present

## 2020-03-03 DIAGNOSIS — Z0371 Encounter for suspected problem with amniotic cavity and membrane ruled out: Secondary | ICD-10-CM | POA: Diagnosis not present

## 2020-03-03 DIAGNOSIS — O26893 Other specified pregnancy related conditions, third trimester: Secondary | ICD-10-CM | POA: Diagnosis not present

## 2020-03-03 DIAGNOSIS — B3731 Acute candidiasis of vulva and vagina: Secondary | ICD-10-CM

## 2020-03-03 DIAGNOSIS — O98812 Other maternal infectious and parasitic diseases complicating pregnancy, second trimester: Secondary | ICD-10-CM | POA: Diagnosis not present

## 2020-03-03 LAB — CBC
HCT: 37.8 % (ref 36.0–46.0)
Hemoglobin: 13 g/dL (ref 12.0–15.0)
MCH: 31.9 pg (ref 26.0–34.0)
MCHC: 34.4 g/dL (ref 30.0–36.0)
MCV: 92.6 fL (ref 80.0–100.0)
Platelets: 220 10*3/uL (ref 150–400)
RBC: 4.08 MIL/uL (ref 3.87–5.11)
RDW: 13.3 % (ref 11.5–15.5)
WBC: 11.2 10*3/uL — ABNORMAL HIGH (ref 4.0–10.5)
nRBC: 0 % (ref 0.0–0.2)

## 2020-03-03 LAB — URINALYSIS, ROUTINE W REFLEX MICROSCOPIC
Bilirubin Urine: NEGATIVE
Glucose, UA: NEGATIVE mg/dL
Hgb urine dipstick: NEGATIVE
Ketones, ur: 5 mg/dL — AB
Nitrite: NEGATIVE
Protein, ur: NEGATIVE mg/dL
Specific Gravity, Urine: 1.018 (ref 1.005–1.030)
pH: 6 (ref 5.0–8.0)

## 2020-03-03 LAB — COMPREHENSIVE METABOLIC PANEL
ALT: 12 U/L (ref 0–44)
AST: 14 U/L — ABNORMAL LOW (ref 15–41)
Albumin: 3.4 g/dL — ABNORMAL LOW (ref 3.5–5.0)
Alkaline Phosphatase: 56 U/L (ref 38–126)
Anion gap: 11 (ref 5–15)
BUN: 5 mg/dL — ABNORMAL LOW (ref 6–20)
CO2: 21 mmol/L — ABNORMAL LOW (ref 22–32)
Calcium: 9.3 mg/dL (ref 8.9–10.3)
Chloride: 106 mmol/L (ref 98–111)
Creatinine, Ser: 0.65 mg/dL (ref 0.44–1.00)
GFR calc Af Amer: >60 mL/min (ref >60)
GFR calc non Af Amer: >60 mL/min (ref >60)
Glucose, Bld: 94 mg/dL (ref 70–99)
Potassium: 3.3 mmol/L — ABNORMAL LOW (ref 3.5–5.1)
Sodium: 138 mmol/L (ref 135–145)
Total Bilirubin: 0.5 mg/dL (ref 0.3–1.2)
Total Protein: 6.6 g/dL (ref 6.5–8.1)

## 2020-03-03 LAB — WET PREP, GENITAL
Clue Cells Wet Prep HPF POC: NONE SEEN
Sperm: NONE SEEN
Trich, Wet Prep: NONE SEEN

## 2020-03-03 LAB — PROTEIN / CREATININE RATIO, URINE
Creatinine, Urine: 175.77 mg/dL
Protein Creatinine Ratio: 0.07 mg/mg{Cre} (ref 0.00–0.15)
Total Protein, Urine: 13 mg/dL

## 2020-03-03 MED ORDER — FLUCONAZOLE 150 MG PO TABS
150.0000 mg | ORAL_TABLET | Freq: Once | ORAL | Status: AC
  Administered 2020-03-03: 150 mg via ORAL
  Filled 2020-03-03: qty 1

## 2020-03-03 MED ORDER — ACETAMINOPHEN 500 MG PO TABS
1000.0000 mg | ORAL_TABLET | Freq: Once | ORAL | Status: AC
  Administered 2020-03-03: 1000 mg via ORAL
  Filled 2020-03-03: qty 2

## 2020-03-03 NOTE — MAU Note (Signed)
Pt presents to MAU with c/o PROM. She has noticed clear fluid leaking since last week. She has had lower abdominal pain x 2 weeks. Pt denies VB.

## 2020-03-03 NOTE — Discharge Instructions (Signed)
Signs and Symptoms of Labor Labor is your body's natural process of moving your baby, placenta, and umbilical cord out of your uterus. The process of labor usually starts when your baby is full-term, between 37 and 40 weeks of pregnancy. How will I know when I am close to going into labor? As your body prepares for labor and the birth of your baby, you may notice the following symptoms in the weeks and days before true labor starts:  Having a strong desire to get your home ready to receive your new baby. This is called nesting. Nesting may be a sign that labor is approaching, and it may occur several weeks before birth. Nesting may involve cleaning and organizing your home.  Passing a small amount of thick, bloody mucus out of your vagina (normal bloody show or losing your mucus plug). This may happen more than a week before labor begins, or it might occur right before labor begins as the opening of the cervix starts to widen (dilate). For some women, the entire mucus plug passes at once. For others, smaller portions of the mucus plug may gradually pass over several days.  Your baby moving (dropping) lower in your pelvis to get into position for birth (lightening). When this happens, you may feel more pressure on your bladder and pelvic bone and less pressure on your ribs. This may make it easier to breathe. It may also cause you to need to urinate more often and have problems with bowel movements.  Having "practice contractions" (Braxton Hicks contractions) that occur at irregular (unevenly spaced) intervals that are more than 10 minutes apart. This is also called false labor. False labor contractions are common after exercise or sexual activity, and they will stop if you change position, rest, or drink fluids. These contractions are usually mild and do not get stronger over time. They may feel like: ? A backache or back pain. ? Mild cramps, similar to menstrual cramps. ? Tightening or pressure in  your abdomen. Other early symptoms that labor may be starting soon include:  Nausea or loss of appetite.  Diarrhea.  Having a sudden burst of energy, or feeling very tired.  Mood changes.  Having trouble sleeping. How will I know when labor has begun? Signs that true labor has begun may include:  Having contractions that come at regular (evenly spaced) intervals and increase in intensity. This may feel like more intense tightening or pressure in your abdomen that moves to your back. ? Contractions may also feel like rhythmic pain in your upper thighs or back that comes and goes at regular intervals. ? For first-time mothers, this change in intensity of contractions often occurs at a more gradual pace. ? Women who have given birth before may notice a more rapid progression of contraction changes.  Having a feeling of pressure in the vaginal area.  Your water breaking (rupture of membranes). This is when the sac of fluid that surrounds your baby breaks. When this happens, you will notice fluid leaking from your vagina. This may be clear or blood-tinged. Labor usually starts within 24 hours of your water breaking, but it may take longer to begin. ? Some women notice this as a gush of fluid. ? Others notice that their underwear repeatedly becomes damp. Follow these instructions at home:   When labor starts, or if your water breaks, call your health care provider or nurse care line. Based on your situation, they will determine when you should go in for an   exam.  When you are in early labor, you may be able to rest and manage symptoms at home. Some strategies to try at home include: ? Breathing and relaxation techniques. ? Taking a warm bath or shower. ? Listening to music. ? Using a heating pad on the lower back for pain. If you are directed to use heat:  Place a towel between your skin and the heat source.  Leave the heat on for 20-30 minutes.  Remove the heat if your skin turns  bright red. This is especially important if you are unable to feel pain, heat, or cold. You may have a greater risk of getting burned. Get help right away if:  You have painful, regular contractions that are 5 minutes apart or less.  Labor starts before you are [redacted] weeks along in your pregnancy.  You have a fever.  You have a headache that does not go away.  You have bright red blood coming from your vagina.  You do not feel your baby moving.  You have a sudden onset of: ? Severe headache with vision problems. ? Nausea, vomiting, or diarrhea. ? Chest pain or shortness of breath. These symptoms may be an emergency. If your health care provider recommends that you go to the hospital or birth center where you plan to deliver, do not drive yourself. Have someone else drive you, or call emergency services (911 in the [place] Summary  Labor is your body's natural process of moving your baby, placenta, and umbilical cord out of your uterus.  The process of labor usually starts when your baby is full-term, between 37 and 40 weeks of pregnancy.  When labor starts, or if your water breaks, call your health care provider or nurse care line. Based on your situation, they will determine when you should go in for an exam. This information is not intended to replace advice given to you by your health care provider. Make sure you discuss any questions you have with your health care provider. Document Revised: 07/25/2017 Document Reviewed: 04/01/2017 Elsevier Patient Education  2020 Elsevier Inc.     Hypertension During Pregnancy Hypertension is also called high blood pressure. High blood pressure means that the force of your blood moving in your body is too strong. It can cause problems for you and your baby. Different types of high blood pressure can happen during pregnancy. The types are:  High blood pressure before you got pregnant. This is called chronic hypertension.  This can continue during  your pregnancy. Your doctor will want to keep checking your blood pressure. You may need medicine to keep your blood pressure under control while you are pregnant. You will need follow-up visits after you have your baby.  High blood pressure that goes up during pregnancy when it was normal before. This is called gestational hypertension. It will usually get better after you have your baby, but your doctor will need to watch your blood pressure to make sure that it is getting better.  Very high blood pressure during pregnancy. This is called preeclampsia. Very high blood pressure is an emergency that needs to be checked and treated right away.  You may develop very high blood pressure after giving birth. This is called postpartum preeclampsia. This usually occurs within 48 hours after childbirth but may occur up to 6 weeks after giving birth. This is rare. How does this affect me? If you have high blood pressure during pregnancy, you have a higher chance of developing high blood   pressure:  As you get older.  If you get pregnant again. In some cases, high blood pressure during pregnancy can cause:  Stroke.  Heart attack.  Damage to the kidneys, lungs, or liver.  Preeclampsia.  Jerky movements you cannot control (convulsions or seizures).  Problems with the placenta. How does this affect my baby? Your baby may:  Be born early.  Not weigh as much as he or she should.  Not handle labor well, leading to a c-section birth. What are the risks?  Having high blood pressure during a past pregnancy.  Being overweight.  Being 35 years old or older.  Being pregnant for the first time.  Being pregnant with more than one baby.  Becoming pregnant using fertility methods, such as IVF.  Having other problems, such as diabetes, or kidney disease.  Having family members who have high blood pressure. What can I do to lower my risk?   Keep a healthy weight.  Eat a healthy  diet.  Follow what your doctor tells you about treating any medical problems that you had before becoming pregnant. It is very important to go to all of your doctor visits. Your doctor will check your blood pressure and make sure that your pregnancy is progressing as it should. Treatment should start early if a problem is found. How is this treated? Treatment for high blood pressure during pregnancy can differ depending on the type of high blood pressure you have and how serious it is.  You may need to take blood pressure medicine.  If you have been taking medicine for your blood pressure, you may need to change the medicine during pregnancy if it is not safe for your baby.  If your doctor thinks that you could get very high blood pressure, he or she may tell you to take a low-dose aspirin during your pregnancy.  If you have very high blood pressure, you may need to stay in the hospital so you and your baby can be watched closely. You may also need to take medicine to lower your blood pressure. This medicine may be given by mouth or through an IV tube.  In some cases, if your condition gets worse, you may need to have your baby early. Follow these instructions at home: Eating and drinking   Drink enough fluid to keep your pee (urine) pale yellow.  Avoid caffeine. Lifestyle  Do not use any products that contain nicotine or tobacco, such as cigarettes, e-cigarettes, and chewing tobacco. If you need help quitting, ask your doctor.  Do not use alcohol or drugs.  Avoid stress.  Rest and get plenty of sleep.  Regular exercise can help. Ask your doctor what kinds of exercise are best for you. General instructions  Take over-the-counter and prescription medicines only as told by your doctor.  Keep all prenatal and follow-up visits as told by your doctor. This is important. Contact a doctor if:  You have symptoms that your doctor told you to watch for, such  as: ? Headaches. ? Nausea. ? Vomiting. ? Belly (abdominal) pain. ? Dizziness. ? Light-headedness. Get help right away if:  You have: ? Very bad belly pain that does not get better with treatment. ? A very bad headache that does not get better. ? Vomiting that does not get better. ? Sudden, fast weight gain. ? Sudden swelling in your hands, ankles, or face. ? Bleeding from your vagina. ? Blood in your pee. ? Blurry vision. ? Double vision. ? Shortness of   breath. ? Chest pain. ? Weakness on one side of your body. ? Trouble talking.  Your baby is not moving as much as usual. Summary  High blood pressure is also called hypertension.  High blood pressure means that the force of your blood moving in your body is too strong.  High blood pressure can cause problems for you and your baby.  Keep all follow-up visits as told by your doctor. This is important. This information is not intended to replace advice given to you by your health care provider. Make sure you discuss any questions you have with your health care provider. Document Revised: 02/15/2019 Document Reviewed: 11/21/2018 Elsevier Patient Education  2020 Elsevier Inc.  

## 2020-03-03 NOTE — MAU Provider Note (Addendum)
History     [id number]  Arrival date and time: 03/03/20 1641    Chief Complaint  Patient presents with  . Rupture of Membranes     HPI Joanne Mcguire is a 39 y.o. at [redacted]w[redacted]d by LMP c/w 9 wk [place] with PMHx notable for CS x4, obesity, depression, AMA pregnancy, who presents for loss of fluid.   Prenatal records from [facility] reviewed  Called the office this morning because she noticed leakage of fluid from vagina Has been clear fluid, persistent for several days Thought it was urine but called office, told to come to MAU for evaluation Has never had her water break during prior pregnancies No vaginal bleeding No contractions No abdominal pain, no nausea or vomiting No burning or pain with urination Has not noticed any position that makes leakage better or worse Was feeling lots of fetal movement last week but over the weekend dropped off significantly, currently has only felt movement once today Last intercourse yesterday morning  Reports she has a headache currently Denies vision changes, chest pain, SOB, RUQ pain Feels like her legs are swelling. Usually do during pregnancy but feels like its started much earlier this time     OB History    Gravida  4   Para  3   Term  2   Preterm  1   AB      Living  3     SAB      TAB      Ectopic      Multiple      Live Births  1           Past Medical History:  Diagnosis Date  . Anxiety   . Chronic headaches   . Complication of anesthesia    Reportedly severe itching following cesarean section and prior knee surgery  . Heartburn in pregnancy   . History of cesarean section 06/10/2012  . Postpartum depression     Past Surgical History:  Procedure Laterality Date  . CESAREAN SECTION     x 3  . CESAREAN SECTION  06/09/2012   Procedure: CESAREAN SECTION;  Surgeon: Lenoard Aden, MD;  Location: [facility];  Service: Gynecology;  Laterality: N/A;  EDD: 06/15/12, repeat  . KNEE ARTHROSCOPY Left 2011  .  TONSILLECTOMY      Family History  Problem Relation Age of Onset  . Bipolar disorder Mother   . Hypothyroidism Mother   . Hypertension Father   . Cirrhosis Father   . Colon cancer Maternal Grandmother        Early 23s  . Colon polyps Maternal Grandmother   . Diabetes Maternal Grandmother   . Cirrhosis Paternal Grandmother   . Other Neg Hx     Social History   Socioeconomic History  . Marital status: Legally Separated    Spouse name: Not on file  . Number of children: Not on file  . Years of education: Not on file  . Highest education level: Not on file  Occupational History  . Not on file  Tobacco Use  . Smoking status: Never Smoker  . Smokeless tobacco: Never Used  Substance and Sexual Activity  . Alcohol use: No  . Drug use: No  . Sexual activity: Yes  Other Topics Concern  . Not on file  Social History Narrative  . Not on file   Social Determinants of Health   Financial Resource Strain:   . Difficulty of Paying Living Expenses:  Food Insecurity:   . Worried About Charity fundraiser in the Last Year:   . Arboriculturist in the Last Year:   Transportation Needs:   . Film/video editor (Medical):   Marland Kitchen Lack of Transportation (Non-Medical):   Physical Activity:   . Days of Exercise per Week:   . Minutes of Exercise per Session:   Stress:   . Feeling of Stress :   Social Connections:   . Frequency of Communication with Friends and Family:   . Frequency of Social Gatherings with Friends and Family:   . Attends Religious Services:   . Active Member of Clubs or Organizations:   . Attends Archivist Meetings:   Marland Kitchen Marital Status:   Intimate Partner Violence:   . Fear of Current or Ex-Partner:   . Emotionally Abused:   Marland Kitchen Physically Abused:   . Sexually Abused:     Allergies  Allergen Reactions  . Oxycodone Itching    Particularly severe itching not responding to oral Benadryl.  Hydrocodone seems to be OK  . Latex Rash    No current  facility-administered medications on file prior to encounter.   No current outpatient medications on file prior to encounter.     ROS Pertinent positives and negative per HPI, all others reviewed and negative  Physical Exam   BP 118/72   Pulse (!) 110   Temp 98.2 F (36.8 C) (Oral)   Resp 18   SpO2 99%   Physical Exam  Vitals reviewed. Constitutional: She appears well-developed and well-nourished. No distress.  Eyes: No scleral icterus.  Respiratory: Effort normal. No respiratory distress.  GI: Soft. She exhibits no distension. There is no abdominal tenderness. There is no rebound and no guarding.  Genitourinary:    Genitourinary Comments: Cervix visually closed, no blood in vaginal vault but copious white discharge present   Musculoskeletal:        General: No edema.  Neurological: She is alert. Coordination normal.  Skin: Skin is warm and dry. She is not diaphoretic.  Psychiatric: She has a normal mood and affect.    Cervical Exam  Deferred  Bedside Ultrasound Normal fetal cardiac activity seen Subjectively normal fluid around fetus  FHT Doppler 135 bpm  Labs Results for orders placed or performed during the hospital encounter of 03/03/20 (from the past 24 hour(s))  Wet prep, genital     Status: Abnormal   Collection Time: 03/03/20  5:38 PM   Specimen: Cervix  Result Value Ref Range   Yeast Wet Prep HPF POC PRESENT (A) NONE SEEN   Trich, Wet Prep NONE SEEN NONE SEEN   Clue Cells Wet Prep HPF POC NONE SEEN NONE SEEN   WBC, Wet Prep HPF POC MANY (A) NONE SEEN   Sperm NONE SEEN   CBC     Status: Abnormal   Collection Time: 03/03/20  5:44 PM  Result Value Ref Range   WBC 11.2 (H) 4.0 - 10.5 K/uL   RBC 4.08 3.87 - 5.11 MIL/uL   Hemoglobin 13.0 12.0 - 15.0 g/dL   HCT 37.8 36.0 - 46.0 %   MCV 92.6 80.0 - 100.0 fL   MCH 31.9 26.0 - 34.0 pg   MCHC 34.4 30.0 - 36.0 g/dL   RDW 13.3 11.5 - 15.5 %   Platelets 220 150 - 400 K/uL   nRBC 0.0 0.0 - 0.2 %   Comprehensive metabolic panel     Status: Abnormal   Collection Time: 03/03/20  5:44 PM  Result Value Ref Range   Sodium 138 135 - 145 mmol/L   Potassium 3.3 (L) 3.5 - 5.1 mmol/L   Chloride 106 98 - 111 mmol/L   CO2 21 (L) 22 - 32 mmol/L   Glucose, Bld 94 70 - 99 mg/dL   BUN 5 (L) 6 - 20 mg/dL   Creatinine, Ser 2.56 0.44 - 1.00 mg/dL   Calcium 9.3 8.9 - 38.9 mg/dL   Total Protein 6.6 6.5 - 8.1 g/dL   Albumin 3.4 (L) 3.5 - 5.0 g/dL   AST 14 (L) 15 - 41 U/L   ALT 12 0 - 44 U/L   Alkaline Phosphatase 56 38 - 126 U/L   Total Bilirubin 0.5 0.3 - 1.2 mg/dL   GFR calc non Af Amer >60 >60 mL/min   GFR calc Af Amer >60 >60 mL/min   Anion gap 11 5 - 15    Imaging No results found.  MAU Course  Procedures  Lab Orders     Wet prep, genital     Urinalysis, Routine w reflex microscopic     CBC     Comprehensive metabolic panel     Protein / creatinine ratio, urine Meds ordered this encounter  Medications  . acetaminophen (TYLENOL) tablet 1,000 mg  . fluconazole (DIFLUCAN) tablet 150 mg   Imaging Orders  No imaging studies ordered today    MDM moderate  Assessment and Plan  #Encounter for possible rupture of membranes, not found Cervix closed, neg pool, neg valsalva BSUS with subjectively normal fluid around fetus Ruled out for ROM  #Vaginal discharge #Yeast vaginitis Wet prep shows +candida, given dose of 150mg  PO diflucan x1 Likely etiology of her symptoms  #Elevated BP w/o diagnosis of HTN Reports HA while in MAU,   Hx of PreE in prior pregnancy PreE labs drawn and were unremarkable BP's initially mild range but subsequently normal Message sent to [facility] to monitor BP's  #FWB Normal doppler @ 135 bpm  [facility] Mary Sella

## 2020-03-04 LAB — GC/CHLAMYDIA PROBE AMP (~~LOC~~) NOT AT ARMC
Chlamydia: NEGATIVE
Comment: NEGATIVE
Comment: NORMAL
Neisseria Gonorrhea: NEGATIVE

## 2020-03-05 DIAGNOSIS — O09522 Supervision of elderly multigravida, second trimester: Secondary | ICD-10-CM | POA: Diagnosis not present

## 2020-03-05 DIAGNOSIS — Z3A19 19 weeks gestation of pregnancy: Secondary | ICD-10-CM | POA: Diagnosis not present

## 2020-03-11 ENCOUNTER — Other Ambulatory Visit: Payer: Self-pay

## 2020-03-11 ENCOUNTER — Inpatient Hospital Stay (HOSPITAL_COMMUNITY): Payer: BC Managed Care – PPO | Admitting: Certified Registered Nurse Anesthetist

## 2020-03-11 ENCOUNTER — Inpatient Hospital Stay (HOSPITAL_COMMUNITY): Payer: BC Managed Care – PPO

## 2020-03-11 ENCOUNTER — Encounter (HOSPITAL_COMMUNITY)
Admission: AD | Disposition: A | Discharge status: Home / Self Care | Payer: Self-pay | Source: Home / Self Care | Attending: Obstetrics and Gynecology

## 2020-03-11 ENCOUNTER — Encounter (HOSPITAL_COMMUNITY): Payer: Self-pay

## 2020-03-11 ENCOUNTER — Inpatient Hospital Stay (HOSPITAL_COMMUNITY)
Admission: AD | Admit: 2020-03-11 | Discharge: 2020-03-12 | DRG: 818 | Disposition: A | Discharge status: Home / Self Care | Payer: BC Managed Care – PPO | Attending: Obstetrics and Gynecology | Admitting: Obstetrics and Gynecology

## 2020-03-11 DIAGNOSIS — O34219 Maternal care for unspecified type scar from previous cesarean delivery: Secondary | ICD-10-CM | POA: Diagnosis not present

## 2020-03-11 DIAGNOSIS — K3580 Unspecified acute appendicitis: Secondary | ICD-10-CM | POA: Diagnosis present

## 2020-03-11 DIAGNOSIS — Z20822 Contact with and (suspected) exposure to covid-19: Secondary | ICD-10-CM | POA: Diagnosis not present

## 2020-03-11 DIAGNOSIS — K353 Acute appendicitis with localized peritonitis, without perforation or gangrene: Secondary | ICD-10-CM | POA: Diagnosis not present

## 2020-03-11 DIAGNOSIS — Z3A20 20 weeks gestation of pregnancy: Secondary | ICD-10-CM | POA: Diagnosis not present

## 2020-03-11 DIAGNOSIS — R1031 Right lower quadrant pain: Secondary | ICD-10-CM | POA: Diagnosis not present

## 2020-03-11 DIAGNOSIS — Z331 Pregnant state, incidental: Secondary | ICD-10-CM

## 2020-03-11 DIAGNOSIS — K37 Unspecified appendicitis: Secondary | ICD-10-CM | POA: Diagnosis not present

## 2020-03-11 DIAGNOSIS — O99612 Diseases of the digestive system complicating pregnancy, second trimester: Principal | ICD-10-CM | POA: Diagnosis present

## 2020-03-11 DIAGNOSIS — O09522 Supervision of elderly multigravida, second trimester: Secondary | ICD-10-CM | POA: Diagnosis not present

## 2020-03-11 HISTORY — PX: LAPAROSCOPIC APPENDECTOMY: SHX408

## 2020-03-11 LAB — CBC WITH DIFFERENTIAL/PLATELET
Abs Immature Granulocytes: 0.08 10*3/uL — ABNORMAL HIGH (ref 0.00–0.07)
Basophils Absolute: 0 10*3/uL (ref 0.0–0.1)
Basophils Relative: 0 %
Eosinophils Absolute: 0 10*3/uL (ref 0.0–0.5)
Eosinophils Relative: 0 %
HCT: 35.1 % — ABNORMAL LOW (ref 36.0–46.0)
Hemoglobin: 12.3 g/dL (ref 12.0–15.0)
Immature Granulocytes: 1 %
Lymphocytes Relative: 15 %
Lymphs Abs: 2 10*3/uL (ref 0.7–4.0)
MCH: 32.8 pg (ref 26.0–34.0)
MCHC: 35 g/dL (ref 30.0–36.0)
MCV: 93.6 fL (ref 80.0–100.0)
Monocytes Absolute: 0.7 10*3/uL (ref 0.1–1.0)
Monocytes Relative: 6 %
Neutro Abs: 10.6 10*3/uL — ABNORMAL HIGH (ref 1.7–7.7)
Neutrophils Relative %: 78 %
Platelets: 191 10*3/uL (ref 150–400)
RBC: 3.75 MIL/uL — ABNORMAL LOW (ref 3.87–5.11)
RDW: 13.2 % (ref 11.5–15.5)
WBC: 13.5 10*3/uL — ABNORMAL HIGH (ref 4.0–10.5)
nRBC: 0 % (ref 0.0–0.2)

## 2020-03-11 LAB — COMPREHENSIVE METABOLIC PANEL
ALT: 11 U/L (ref 0–44)
AST: 13 U/L — ABNORMAL LOW (ref 15–41)
Albumin: 3 g/dL — ABNORMAL LOW (ref 3.5–5.0)
Alkaline Phosphatase: 54 U/L (ref 38–126)
Anion gap: 12 (ref 5–15)
BUN: <5 mg/dL — ABNORMAL LOW (ref 6–20)
CO2: 20 mmol/L — ABNORMAL LOW (ref 22–32)
Calcium: 9.2 mg/dL (ref 8.9–10.3)
Chloride: 105 mmol/L (ref 98–111)
Creatinine, Ser: 0.57 mg/dL (ref 0.44–1.00)
GFR calc Af Amer: >60 mL/min (ref >60)
GFR calc non Af Amer: >60 mL/min (ref >60)
Glucose, Bld: 129 mg/dL — ABNORMAL HIGH (ref 70–99)
Potassium: 3.2 mmol/L — ABNORMAL LOW (ref 3.5–5.1)
Sodium: 137 mmol/L (ref 135–145)
Total Bilirubin: 0.7 mg/dL (ref 0.3–1.2)
Total Protein: 6.1 g/dL — ABNORMAL LOW (ref 6.5–8.1)

## 2020-03-11 LAB — URINALYSIS, ROUTINE W REFLEX MICROSCOPIC
Bilirubin Urine: NEGATIVE
Glucose, UA: NEGATIVE mg/dL
Hgb urine dipstick: NEGATIVE
Ketones, ur: 20 mg/dL — AB
Nitrite: NEGATIVE
Protein, ur: NEGATIVE mg/dL
Specific Gravity, Urine: 1.009 (ref 1.005–1.030)
pH: 6 (ref 5.0–8.0)

## 2020-03-11 LAB — TYPE AND SCREEN
ABO/RH(D): O POS
Antibody Screen: NEGATIVE

## 2020-03-11 LAB — ABO/RH: ABO/RH(D): O POS

## 2020-03-11 LAB — RESPIRATORY PANEL BY RT PCR (FLU A&B, COVID)
Influenza A by PCR: NEGATIVE
Influenza B by PCR: NEGATIVE
SARS Coronavirus 2 by RT PCR: NEGATIVE

## 2020-03-11 LAB — AMYLASE: Amylase: 32 U/L (ref 28–100)

## 2020-03-11 LAB — LIPASE, BLOOD: Lipase: 24 U/L (ref 11–51)

## 2020-03-11 SURGERY — APPENDECTOMY, LAPAROSCOPIC
Anesthesia: General

## 2020-03-11 MED ORDER — PHENYLEPHRINE HCL-NACL 10-0.9 MG/250ML-% IV SOLN
INTRAVENOUS | Status: DC | PRN
  Administered 2020-03-11: 120 ug/min via INTRAVENOUS

## 2020-03-11 MED ORDER — ACETAMINOPHEN 500 MG PO TABS
1000.0000 mg | ORAL_TABLET | Freq: Four times a day (QID) | ORAL | Status: DC
  Administered 2020-03-11 – 2020-03-12 (×3): 1000 mg via ORAL
  Filled 2020-03-11 (×3): qty 2

## 2020-03-11 MED ORDER — MEPERIDINE HCL 25 MG/ML IJ SOLN: 6.2500 mg | INTRAMUSCULAR | Status: DC | PRN

## 2020-03-11 MED ORDER — ONDANSETRON HCL 4 MG/2ML IJ SOLN
INTRAMUSCULAR | Status: DC | PRN
  Administered 2020-03-11: 4 mg via INTRAVENOUS

## 2020-03-11 MED ORDER — LIDOCAINE 2% (20 MG/ML) 5 ML SYRINGE
INTRAMUSCULAR | Status: DC | PRN
  Administered 2020-03-11: 50 mg via INTRAVENOUS

## 2020-03-11 MED ORDER — HYDROMORPHONE HCL 1 MG/ML IJ SOLN
0.2500 mg | INTRAMUSCULAR | Status: DC | PRN
  Administered 2020-03-11 (×2): 0.5 mg via INTRAVENOUS

## 2020-03-11 MED ORDER — FENTANYL CITRATE (PF) 100 MCG/2ML IJ SOLN
INTRAMUSCULAR | Status: DC | PRN
  Administered 2020-03-11: 100 ug via INTRAVENOUS
  Administered 2020-03-11 (×3): 50 ug via INTRAVENOUS

## 2020-03-11 MED ORDER — SUCCINYLCHOLINE CHLORIDE 200 MG/10ML IV SOSY
PREFILLED_SYRINGE | INTRAVENOUS | Status: AC
  Filled 2020-03-11: qty 10

## 2020-03-11 MED ORDER — SODIUM CHLORIDE 0.9 % IR SOLN
Status: DC | PRN
  Administered 2020-03-11: 1000 mL

## 2020-03-11 MED ORDER — LIDOCAINE 2% (20 MG/ML) 5 ML SYRINGE
INTRAMUSCULAR | Status: AC
  Filled 2020-03-11: qty 5

## 2020-03-11 MED ORDER — SUCCINYLCHOLINE CHLORIDE 20 MG/ML IJ SOLN
INTRAMUSCULAR | Status: DC | PRN
  Administered 2020-03-11: 160 mg via INTRAVENOUS

## 2020-03-11 MED ORDER — SUGAMMADEX SODIUM 200 MG/2ML IV SOLN
INTRAVENOUS | Status: DC | PRN
Start: 2020-03-11 — End: 2020-03-11
  Administered 2020-03-11: 200 mg via INTRAVENOUS

## 2020-03-11 MED ORDER — ZOLPIDEM TARTRATE 5 MG PO TABS
5.0000 mg | ORAL_TABLET | Freq: Every evening | ORAL | Status: DC | PRN
  Administered 2020-03-11: 23:00:00 5 mg via ORAL
  Filled 2020-03-11: qty 1

## 2020-03-11 MED ORDER — HYDROMORPHONE HCL 1 MG/ML IJ SOLN
INTRAMUSCULAR | Status: AC
  Filled 2020-03-11: qty 1

## 2020-03-11 MED ORDER — FENTANYL CITRATE (PF) 250 MCG/5ML IJ SOLN
INTRAMUSCULAR | Status: AC
  Filled 2020-03-11: qty 5

## 2020-03-11 MED ORDER — DOCUSATE SODIUM 100 MG PO CAPS: 100.0000 mg | ORAL_CAPSULE | Freq: Every day | ORAL | Status: DC

## 2020-03-11 MED ORDER — DEXAMETHASONE SODIUM PHOSPHATE 10 MG/ML IJ SOLN
INTRAMUSCULAR | Status: DC | PRN
  Administered 2020-03-11: 10 mg via INTRAVENOUS

## 2020-03-11 MED ORDER — BUPIVACAINE-EPINEPHRINE 0.25% -1:200000 IJ SOLN
INTRAMUSCULAR | Status: DC | PRN
  Administered 2020-03-11: 10 mL

## 2020-03-11 MED ORDER — PRENATAL MULTIVITAMIN CH: 1.0000 | ORAL_TABLET | Freq: Every day | ORAL | Status: DC

## 2020-03-11 MED ORDER — PROPOFOL 10 MG/ML IV BOLUS
INTRAVENOUS | Status: AC
  Filled 2020-03-11: qty 20

## 2020-03-11 MED ORDER — LACTATED RINGERS IV SOLN: INTRAVENOUS | Status: DC

## 2020-03-11 MED ORDER — 0.9 % SODIUM CHLORIDE (POUR BTL) OPTIME
TOPICAL | Status: DC | PRN
  Administered 2020-03-11: 1000 mL

## 2020-03-11 MED ORDER — FENTANYL CITRATE (PF) 100 MCG/2ML IJ SOLN: 50.0000 ug | INTRAMUSCULAR | Status: DC | PRN

## 2020-03-11 MED ORDER — ROCURONIUM BROMIDE 10 MG/ML (PF) SYRINGE
PREFILLED_SYRINGE | INTRAVENOUS | Status: AC
  Filled 2020-03-11: qty 10

## 2020-03-11 MED ORDER — PROPOFOL 10 MG/ML IV BOLUS
INTRAVENOUS | Status: DC | PRN
  Administered 2020-03-11: 120 mg via INTRAVENOUS

## 2020-03-11 MED ORDER — FENTANYL CITRATE (PF) 100 MCG/2ML IJ SOLN
50.0000 ug | INTRAMUSCULAR | Status: DC | PRN
  Administered 2020-03-11: 50 ug via INTRAVENOUS
  Filled 2020-03-11: qty 2

## 2020-03-11 MED ORDER — SODIUM CHLORIDE 0.9 % IV SOLN: INTRAVENOUS | Status: DC

## 2020-03-11 MED ORDER — BUPIVACAINE HCL (PF) 0.25 % IJ SOLN
INTRAMUSCULAR | Status: AC
  Filled 2020-03-11: qty 30

## 2020-03-11 MED ORDER — ONDANSETRON HCL 4 MG/2ML IJ SOLN
4.0000 mg | Freq: Once | INTRAMUSCULAR | Status: AC
  Administered 2020-03-11: 4 mg via INTRAMUSCULAR
  Filled 2020-03-11: qty 2

## 2020-03-11 MED ORDER — FENTANYL CITRATE (PF) 100 MCG/2ML IJ SOLN
50.0000 ug | Freq: Once | INTRAMUSCULAR | Status: DC
  Filled 2020-03-11: qty 2

## 2020-03-11 MED ORDER — PHENYLEPHRINE HCL (PRESSORS) 10 MG/ML IV SOLN
INTRAVENOUS | Status: DC | PRN
  Administered 2020-03-11 (×2): 120 ug via INTRAVENOUS
  Administered 2020-03-11: 160 ug via INTRAVENOUS

## 2020-03-11 MED ORDER — OXYCODONE HCL 5 MG PO TABS
5.0000 mg | ORAL_TABLET | ORAL | Status: DC | PRN
  Filled 2020-03-11: qty 1

## 2020-03-11 MED ORDER — CALCIUM CARBONATE ANTACID 500 MG PO CHEW: 2.0000 | CHEWABLE_TABLET | ORAL | Status: DC | PRN

## 2020-03-11 MED ORDER — ONDANSETRON HCL 4 MG/2ML IJ SOLN: 4.0000 mg | Freq: Once | INTRAMUSCULAR | Status: DC | PRN

## 2020-03-11 MED ORDER — FENTANYL CITRATE (PF) 100 MCG/2ML IJ SOLN
100.0000 ug | INTRAMUSCULAR | Status: DC | PRN
  Administered 2020-03-11: 100 ug via INTRAVENOUS
  Filled 2020-03-11: qty 2

## 2020-03-11 MED ORDER — ROCURONIUM BROMIDE 10 MG/ML (PF) SYRINGE
PREFILLED_SYRINGE | INTRAVENOUS | Status: DC | PRN
  Administered 2020-03-11 (×2): 50 mg via INTRAVENOUS

## 2020-03-11 MED ORDER — FENTANYL CITRATE (PF) 100 MCG/2ML IJ SOLN
50.0000 ug | Freq: Once | INTRAMUSCULAR | Status: AC
  Administered 2020-03-11: 50 ug via INTRAMUSCULAR

## 2020-03-11 MED ORDER — SODIUM CHLORIDE 0.9 % IV SOLN
2.0000 g | INTRAVENOUS | Status: DC
  Administered 2020-03-11: 2 g via INTRAVENOUS
  Filled 2020-03-11: qty 2

## 2020-03-11 SURGICAL SUPPLY — 41 items
APPLIER CLIP ROT 10 11.4 M/L (STAPLE)
CANISTER SUCT 3000ML PPV (MISCELLANEOUS) IMPLANT
CHLORAPREP W/TINT 26 (MISCELLANEOUS) ×2 IMPLANT
CLIP APPLIE ROT 10 11.4 M/L (STAPLE) IMPLANT
COVER SURGICAL LIGHT HANDLE (MISCELLANEOUS) ×2 IMPLANT
COVER WAND RF STERILE (DRAPES) IMPLANT
CUTTER FLEX LINEAR 45M (STAPLE) ×2 IMPLANT
DERMABOND ADVANCED (GAUZE/BANDAGES/DRESSINGS) ×1
DERMABOND ADVANCED .7 DNX12 (GAUZE/BANDAGES/DRESSINGS) ×1 IMPLANT
ELECT REM PT RETURN 9FT ADLT (ELECTROSURGICAL) ×2
ELECTRODE REM PT RTRN 9FT ADLT (ELECTROSURGICAL) ×1 IMPLANT
GLOVE BIO SURGEON STRL SZ7 (GLOVE) ×2 IMPLANT
GLOVE BIOGEL PI IND STRL 7.5 (GLOVE) ×1 IMPLANT
GLOVE BIOGEL PI INDICATOR 7.5 (GLOVE) ×1
GOWN STRL REUS W/ TWL LRG LVL3 (GOWN DISPOSABLE) ×3 IMPLANT
GOWN STRL REUS W/TWL LRG LVL3 (GOWN DISPOSABLE) ×6
GRASPER SUT TROCAR 14GX15 (MISCELLANEOUS) ×2 IMPLANT
KIT BASIN OR (CUSTOM PROCEDURE TRAY) ×2 IMPLANT
KIT TURNOVER KIT B (KITS) ×2 IMPLANT
NS IRRIG 1000ML POUR BTL (IV SOLUTION) ×2 IMPLANT
PAD ARMBOARD 7.5X6 YLW CONV (MISCELLANEOUS) ×4 IMPLANT
POUCH RETRIEVAL ECOSAC 10 (ENDOMECHANICALS) ×1 IMPLANT
POUCH RETRIEVAL ECOSAC 10MM (ENDOMECHANICALS) ×2
RELOAD 45 VASCULAR/THIN (ENDOMECHANICALS) IMPLANT
RELOAD STAPLE TA45 3.5 REG BLU (ENDOMECHANICALS) ×2 IMPLANT
SCISSORS LAP 5X35 DISP (ENDOMECHANICALS) IMPLANT
SET IRRIG TUBING LAPAROSCOPIC (IRRIGATION / IRRIGATOR) ×2 IMPLANT
SET TUBE SMOKE EVAC HIGH FLOW (TUBING) ×2 IMPLANT
SHEARS HARMONIC ACE PLUS 36CM (ENDOMECHANICALS) ×2 IMPLANT
SLEEVE ENDOPATH XCEL 5M (ENDOMECHANICALS) ×2 IMPLANT
SPECIMEN JAR SMALL (MISCELLANEOUS) ×2 IMPLANT
STRIP CLOSURE SKIN 1/2X4 (GAUZE/BANDAGES/DRESSINGS) IMPLANT
SUT MNCRL AB 4-0 PS2 18 (SUTURE) ×2 IMPLANT
SUT VICRYL 0 UR6 27IN ABS (SUTURE) ×4 IMPLANT
TOWEL GREEN STERILE (TOWEL DISPOSABLE) ×2 IMPLANT
TOWEL GREEN STERILE FF (TOWEL DISPOSABLE) ×2 IMPLANT
TRAY FOLEY MTR SLVR 16FR STAT (SET/KITS/TRAYS/PACK) IMPLANT
TRAY LAPAROSCOPIC MC (CUSTOM PROCEDURE TRAY) ×2 IMPLANT
TROCAR XCEL BLUNT TIP 100MML (ENDOMECHANICALS) ×2 IMPLANT
TROCAR XCEL NON-BLD 5MMX100MML (ENDOMECHANICALS) ×2 IMPLANT
WATER STERILE IRR 1000ML POUR (IV SOLUTION) ×2 IMPLANT

## 2020-03-11 NOTE — Op Note (Signed)
Preoperative diagnosis: Acute appendicitis Postoperative diagnosis: Acute suppurative appendicitis Procedure: Laparoscopic appendectomy Surgeon: Dr. Harden Mo Anesthesia: General Estimated blood loss: 10 cc Complications: None Drains: None Specimens: Appendix to pathology Sponge needle count was correct at completion Disposition to recovery in stable condition  Indications: 39 yof [redacted] weeks gestation presents with rlq pain and mri with appendicitis. Clinically I thought she had appendicitis also. I discussed lap appy as safest route and discussed risk of fetal loss.    Procedure: After informed consent was obtained the patient was taken the operating room. She had already received antibiotics. SCDs were in place.She was placed under general anesthesia without complication. Her is abdomen was prepped and draped in the standard sterile surgical fashion. Surgical timeout was then performed.  I filtrated Marcaine above her umbilicus. I made a vertical incision. I grasped the fascia and incised this sharply. I entered her peritoneum bluntly. There was no evidence of an entry injury. I placed a 0 Vicryl pursestring suture through the fascia. I then inserted a Hassan trocar and insufflated the abdomen to 15 mmHg pressure. I then inserted 2 additional 5 mm trocars in the upper abdomen under direct vision without complication. I then began to explore her right lower quadrant. The veil of Aileen Pilot was adherent to the cecum which I removed bluntly. I was able to identify the cecum and eventually was able to identify the appendiceal base. I then was able to using a combination of blunt dissection and the harmonic scalpel encircle the base. I very clearly saw the taenia in the cecum to confirm that location.I then used a GIA stapler to divide this at the base. I placed the appendix in a retrieval bag and removed it from the abdomen. I reinspected the staple line and this was viable. This was clearly on the  cecum. The terminal ileum was not disturbed. The uterus was also not violated. I then irrigated and evacuated this. Hemostasis was observed. I then removed my Hassan trocar and tied my pursestring down. I used the suture passer device to place an additional 0 Vicryl suture to completely obliterate the defect. I then remove the remaining trocars after desufflating the abdomen. These were closed with 4-0 Monocryl and glue. She tolerated this well was extubated and transferred to the recovery room in stable condition.

## 2020-03-11 NOTE — MAU Provider Note (Addendum)
History     CSN: [id number]  Arrival date and time: 03/11/20 2992   First Provider Initiated Contact with Patient 03/11/20 0522      Chief Complaint  Patient presents with  . Abdominal Pain   Joanne Mcguire is a 39 y.o. [phone] at [redacted]w[redacted]d who presents to MAU for RLQ abdominal pain.  Onset: 4pm yesterday afternoon Location: RLQ primary that radiates to entire stomach Duration: <24hrs Character: constant, feels like she needs to have a bowel movement, "it just really hurts" Aggravating/Associated: worse with movement/nausea with vomiting, but feels like she needs to, chills, low grade fever Relieving: sitting up, standing up Treatment: TUMS Severity: 10/10 at times  Pt denies VB, LOF, ctx, vaginal discharge/odor/itching. Pt denies constipation, diarrhea, or urinary problems. Pt denies fatigue, sweating or changes in appetite. Pt denies SOB or chest pain. Pt denies dizziness, HA, light-headedness, weakness.  Problems this pregnancy include: hx of uterine rupture?Marland Kitchen Allergies? Oxycodone, latex Current medications/supplements? none Prenatal care provider? [facility], next appt 03/21/2020   OB History    Gravida  4   Para  3   Term  2   Preterm  1   AB      Living  3     SAB      TAB      Ectopic      Multiple      Live Births  1           Past Medical History:  Diagnosis Date  . Anxiety   . Chronic headaches   . Complication of anesthesia    Reportedly severe itching following cesarean section and prior knee surgery  . Heartburn in pregnancy   . History of cesarean section 06/10/2012  . Postpartum depression     Past Surgical History:  Procedure Laterality Date  . CESAREAN SECTION     x 3  . CESAREAN SECTION  06/09/2012   Procedure: CESAREAN SECTION;  Surgeon: Lovenia Kim, MD;  Location: [facility];  Service: Gynecology;  Laterality: N/A;  EDD: 06/15/12, repeat  . KNEE ARTHROSCOPY Left 2011  . TONSILLECTOMY      Family History  Problem  Relation Age of Onset  . Bipolar disorder Mother   . Hypothyroidism Mother   . Hypertension Father   . Cirrhosis Father   . Colon cancer Maternal Grandmother        Early 28s  . Colon polyps Maternal Grandmother   . Diabetes Maternal Grandmother   . Cirrhosis Paternal Grandmother   . Other Neg Hx     Social History   Tobacco Use  . Smoking status: Never Smoker  . Smokeless tobacco: Never Used  Substance Use Topics  . Alcohol use: No  . Drug use: No    Allergies:  Allergies  Allergen Reactions  . Oxycodone Itching    Particularly severe itching not responding to oral Benadryl.  Hydrocodone seems to be OK  . Latex Rash    Medications Prior to Admission  Medication Sig Dispense Refill Last Dose  . calcium carbonate (TUMS - DOSED IN MG ELEMENTAL CALCIUM) 500 MG chewable tablet Chew 1 tablet by mouth daily.   03/11/2020 at Unknown time    Review of Systems  Constitutional: Positive for chills and fever. Negative for diaphoresis and fatigue.  Eyes: Negative for visual disturbance.  Respiratory: Negative for shortness of breath.   Cardiovascular: Negative for chest pain.  Gastrointestinal: Positive for abdominal pain, nausea and vomiting. Negative for constipation and diarrhea.  Genitourinary: Negative for dysuria, flank pain, frequency, pelvic pain, urgency, vaginal bleeding and vaginal discharge.  Neurological: Negative for dizziness, weakness, light-headedness and headaches.   Physical Exam   Blood pressure 111/62, pulse (!) 108, temperature 98 F (36.7 C), temperature source Oral, resp. rate 18, weight 100.4 kg, SpO2 98 %.  Patient Vitals for the past 24 hrs:  BP Temp Temp src Pulse Resp SpO2 Weight  03/11/20 1156 111/62 98 F (36.7 C) Oral (!) 108 18 98 % --  03/11/20 0723 116/71 98 F (36.7 C) Oral (!) 101 18 98 % --  03/11/20 0419 126/87 -- -- (!) 125 19 -- 100.4 kg   Physical Exam  Constitutional: She is oriented to person, place, and time. She appears  well-developed and well-nourished. No distress.  HENT:  Head: Normocephalic and atraumatic.  Respiratory: Effort normal.  GI: Soft. She exhibits no distension and no mass. There is abdominal tenderness. There is rebound and guarding. There is no CVA tenderness.  Generalized abdominal tenderness on exam, normal bowel sounds with majority of tenderness in RLQ  Musculoskeletal:        General: Normal range of motion.     Cervical back: Normal range of motion.  Neurological: She is alert and oriented to person, place, and time.  Skin: Skin is warm and dry. She is not diaphoretic.  Psychiatric: She has a normal mood and affect. Her behavior is normal. Judgment and thought content normal.  Pelvic exam deferred pending pain medication  Results for orders placed or performed during the hospital encounter of 03/11/20 (from the past 24 hour(s))  Urinalysis, Routine w reflex microscopic     Status: Abnormal   Collection Time: 03/11/20  4:21 AM  Result Value Ref Range   Color, Urine YELLOW YELLOW   APPearance CLOUDY (A) CLEAR   Specific Gravity, Urine 1.009 1.005 - 1.030   pH 6.0 5.0 - 8.0   Glucose, UA NEGATIVE NEGATIVE mg/dL   Hgb urine dipstick NEGATIVE NEGATIVE   Bilirubin Urine NEGATIVE NEGATIVE   Ketones, ur 20 (A) NEGATIVE mg/dL   Protein, ur NEGATIVE NEGATIVE mg/dL   Nitrite NEGATIVE NEGATIVE   Leukocytes,Ua TRACE (A) NEGATIVE   RBC / HPF 0-5 0 - 5 RBC/hpf   WBC, UA 6-10 0 - 5 WBC/hpf   Bacteria, UA RARE (A) NONE SEEN   Squamous Epithelial / LPF 21-50 0 - 5   Mucus PRESENT   CBC with Differential/Platelet     Status: Abnormal   Collection Time: 03/11/20  6:42 AM  Result Value Ref Range   WBC 13.5 (H) 4.0 - 10.5 K/uL   RBC 3.75 (L) 3.87 - 5.11 MIL/uL   Hemoglobin 12.3 12.0 - 15.0 g/dL   HCT 06.3 (L) 01.6 - 01.0 %   MCV 93.6 80.0 - 100.0 fL   MCH 32.8 26.0 - 34.0 pg   MCHC 35.0 30.0 - 36.0 g/dL   RDW 93.2 35.5 - 73.2 %   Platelets 191 150 - 400 K/uL   nRBC 0.0 0.0 - 0.2 %    Neutrophils Relative % 78 %   Neutro Abs 10.6 (H) 1.7 - 7.7 K/uL   Lymphocytes Relative 15 %   Lymphs Abs 2.0 0.7 - 4.0 K/uL   Monocytes Relative 6 %   Monocytes Absolute 0.7 0.1 - 1.0 K/uL   Eosinophils Relative 0 %   Eosinophils Absolute 0.0 0.0 - 0.5 K/uL   Basophils Relative 0 %   Basophils Absolute 0.0 0.0 - 0.1 K/uL  Immature Granulocytes 1 %   Abs Immature Granulocytes 0.08 (H) 0.00 - 0.07 K/uL  Comprehensive metabolic panel     Status: Abnormal   Collection Time: 03/11/20  6:42 AM  Result Value Ref Range   Sodium 137 135 - 145 mmol/L   Potassium 3.2 (L) 3.5 - 5.1 mmol/L   Chloride 105 98 - 111 mmol/L   CO2 20 (L) 22 - 32 mmol/L   Glucose, Bld 129 (H) 70 - 99 mg/dL   BUN <5 (L) 6 - 20 mg/dL   Creatinine, Ser 4.09 0.44 - 1.00 mg/dL   Calcium 9.2 8.9 - 81.1 mg/dL   Total Protein 6.1 (L) 6.5 - 8.1 g/dL   Albumin 3.0 (L) 3.5 - 5.0 g/dL   AST 13 (L) 15 - 41 U/L   ALT 11 0 - 44 U/L   Alkaline Phosphatase 54 38 - 126 U/L   Total Bilirubin 0.7 0.3 - 1.2 mg/dL   GFR calc non Af Amer >60 >60 mL/min   GFR calc Af Amer >60 >60 mL/min   Anion gap 12 5 - 15  Amylase     Status: None   Collection Time: 03/11/20  6:42 AM  Result Value Ref Range   Amylase 32 28 - 100 U/L  Lipase, blood     Status: None   Collection Time: 03/11/20  6:42 AM  Result Value Ref Range   Lipase 24 11 - 51 U/L  Type and screen [facility]     Status: None   Collection Time: 03/11/20  6:45 AM  Result Value Ref Range   ABO/RH(D) O POS    Antibody Screen NEG    Sample Expiration      03/14/2020,2359 Performed at [facility], [address] [place], [place] [address]   Respiratory Panel by RT PCR (Flu A&B, Covid) - Nasopharyngeal Swab     Status: None   Collection Time: 03/11/20 11:03 AM   Specimen: Nasopharyngeal Swab  Result Value Ref Range   SARS Coronavirus 2 by RT PCR NEGATIVE NEGATIVE   Influenza A by PCR NEGATIVE NEGATIVE   Influenza B by PCR NEGATIVE NEGATIVE   MR  PELVIS WO CONTRAST  Result Date: 03/11/2020 CLINICAL DATA:  Right lower quadrant pain.  [redacted] weeks gestation. EXAM: MRI ABDOMEN AND PELVIS WITHOUT CONTRAST TECHNIQUE: Multiplanar multisequence MR imaging of the abdomen and pelvis was performed. No intravenous contrast was administered. COMPARISON:  None. Abdomen/pelvis CT 03/01/2017 FINDINGS: COMBINED FINDINGS FOR BOTH MR ABDOMEN AND PELVIS Lower chest: Unremarkable. Hepatobiliary: Hepatic dome has not been included in the field of view on all pulse sequences. Within this limitation, liver is unremarkable. There is no evidence for gallstones, gallbladder wall thickening, or pericholecystic fluid. No intrahepatic or extrahepatic biliary dilation. Pancreas: No focal mass lesion. No dilatation of the main duct. No intraparenchymal cyst. No peripancreatic edema. Spleen:  No splenomegaly. No focal mass lesion. Adrenals/Urinary Tract: No adrenal nodule or mass. Kidneys unremarkable. No hydroureteronephrosis. Bladder is nondistended. Stomach/Bowel: Stomach is unremarkable. No gastric wall thickening. No evidence of outlet obstruction. Duodenum is normally positioned as is the ligament of Treitz. No small bowel wall thickening. No small bowel dilatation. Cecum is identified in the lateral aspect of the right mid abdomen. The appendix is visible on coronal T2 haste without fat suppression (images 21-26 of series 3 between the cecum in the right uterine fundus). There is a tiny collection of fluid medial to the appendiceal tip on image 26 of series  3. Appendix also visible on axial haste imaging with fluid signal intensity in the lumen and appendiceal diameter measured at 7 mm. Although the periappendiceal fat does not appear markedly abnormal on the non fat suppressed T2 images, T2 imaging with fat suppression suggests abnormal increased signal in the periappendiceal fat (see axial T2 image 34 of series 13 and coronal fat suppressed haste images 22 and 23 of series 4). There  is no evidence for right lower quadrant abscess. Trace amount of fluid is seen around the cecal tip extending down along the right aspect of the uterus. Vascular/Lymphatic: No abdominal aortic aneurysm. No abdominal lymphadenopathy. No pelvic lymphadenopathy Reproductive: Single intrauterine gestation identified. Left ovary is normal in appearance. Right ovary not well seen but no right adnexal mass. Other:  No evidence for intraperitoneal abscess. Musculoskeletal: No abnormal marrow signal within the visualized bony anatomy. IMPRESSION: 1. Appendix is visualized between the cecum and the right aspect of the upper uterus, mildly distended at 7 mm diameter with fluid signal in the lumen. There is a tiny fluid collection adjacent to the tip of the appendix and although the fat signal is not substantially abnormal on non-fat suppressed imaging, T2 imaging with fat suppression suggests that there is some increased signal intensity around the appendix, raising concern for subtle or mild periappendiceal inflammation. While not definite, given the mild appendiceal distension and apparent subtle increased signal intensity around the appendix on fat suppressed T2 imaging, acute appendicitis cannot be excluded on this study. 2. No evidence for gross intraperitoneal free fluid. No pelvic abscess evident by noncontrast MRI. Electronically Signed   By: Kennith CenterEric  Mansell M.D.   On: 03/11/2020 10:01   MR ABDOMEN WO CONTRAST  Result Date: 03/11/2020 CLINICAL DATA:  Right lower quadrant pain.  [redacted] weeks gestation. EXAM: MRI ABDOMEN AND PELVIS WITHOUT CONTRAST TECHNIQUE: Multiplanar multisequence MR imaging of the abdomen and pelvis was performed. No intravenous contrast was administered. COMPARISON:  None. Abdomen/pelvis CT 03/01/2017 FINDINGS: COMBINED FINDINGS FOR BOTH MR ABDOMEN AND PELVIS Lower chest: Unremarkable. Hepatobiliary: Hepatic dome has not been included in the field of view on all pulse sequences. Within this  limitation, liver is unremarkable. There is no evidence for gallstones, gallbladder wall thickening, or pericholecystic fluid. No intrahepatic or extrahepatic biliary dilation. Pancreas: No focal mass lesion. No dilatation of the main duct. No intraparenchymal cyst. No peripancreatic edema. Spleen:  No splenomegaly. No focal mass lesion. Adrenals/Urinary Tract: No adrenal nodule or mass. Kidneys unremarkable. No hydroureteronephrosis. Bladder is nondistended. Stomach/Bowel: Stomach is unremarkable. No gastric wall thickening. No evidence of outlet obstruction. Duodenum is normally positioned as is the ligament of Treitz. No small bowel wall thickening. No small bowel dilatation. Cecum is identified in the lateral aspect of the right mid abdomen. The appendix is visible on coronal T2 haste without fat suppression (images 21-26 of series 3 between the cecum in the right uterine fundus). There is a tiny collection of fluid medial to the appendiceal tip on image 26 of series 3. Appendix also visible on axial haste imaging with fluid signal intensity in the lumen and appendiceal diameter measured at 7 mm. Although the periappendiceal fat does not appear markedly abnormal on the non fat suppressed T2 images, T2 imaging with fat suppression suggests abnormal increased signal in the periappendiceal fat (see axial T2 image 34 of series 13 and coronal fat suppressed haste images 22 and 23 of series 4). There is no evidence for right lower quadrant abscess. Trace amount of fluid is seen  around the cecal tip extending down along the right aspect of the uterus. Vascular/Lymphatic: No abdominal aortic aneurysm. No abdominal lymphadenopathy. No pelvic lymphadenopathy Reproductive: Single intrauterine gestation identified. Left ovary is normal in appearance. Right ovary not well seen but no right adnexal mass. Other:  No evidence for intraperitoneal abscess. Musculoskeletal: No abnormal marrow signal within the visualized bony  anatomy. IMPRESSION: 1. Appendix is visualized between the cecum and the right aspect of the upper uterus, mildly distended at 7 mm diameter with fluid signal in the lumen. There is a tiny fluid collection adjacent to the tip of the appendix and although the fat signal is not substantially abnormal on non-fat suppressed imaging, T2 imaging with fat suppression suggests that there is some increased signal intensity around the appendix, raising concern for subtle or mild periappendiceal inflammation. While not definite, given the mild appendiceal distension and apparent subtle increased signal intensity around the appendix on fat suppressed T2 imaging, acute appendicitis cannot be excluded on this study. 2. No evidence for gross intraperitoneal free fluid. No pelvic abscess evident by noncontrast MRI. Electronically Signed   By: Kennith Center M.D.   On: 03/11/2020 10:01   MAU Course  Procedures  MDM -r/o appendicitis, MRI orders entered, labs ordered, fentanyl, 4mg  Zofran given, pelvic exam deferred pending pain medication -patient has not eaten a meal since 530PM last night, only sips of wter since that time, patient aware to be NPO in case of need for surgery -care transferred to [facility] CNM @0800AM  [facility], [facility] 03/12/20, [facility]  1010: Consult with Dr. 05/12/20, will come evaluate and consult general surgery. 1045: Dr. [id number] on unit, Dr. Alysia Penna notified, and awaiting general surgery consult.  1055: Pt reports pain meds wearing off, will repeat dose. 1150: Surgery consulted, plan for OR today, Dr. Alysia Penna notified, orders placed  Assessment and Plan   1. Acute appendicitis, unspecified acute appendicitis type   2. [redacted] weeks gestation of pregnancy    Admit to OR then [facility] per Dr. Billy [facility] surgery consulting   [facility], CNM  03/11/2020 12:42 PM

## 2020-03-11 NOTE — Consult Note (Signed)
Joanne Mcguire 07-09-1981  [id number].    Requesting MD: Dr. Amado Nash Chief Complaint/Reason for Consult: Possible appendicitis   HPI: Joanne Mcguire is a 39 y.o. [phone] at [redacted]w[redacted]d who presented to the hospital with right lower quadrant abdominal pain with associated chills and nausea.  Patient reports yesterday around 4 PM she began having a "upset stomach" and describes this as an achiness around her umbilicus.  She reports around 6-7 PM her pain began migrating to the right side of her abdomen, worse in the lower portion and became severe.  She reports her pain was a 7/10 with intermittent episodes of pain that increased to a 10/10.  She reports she began having chills, subjective fever, as well as nausea.  She denies any emesis.  She notes that movement makes her symptoms worse.  She tried Tums for this at home without relief.  She presented to [facility] was found to have WBC 13.5 and questional appendicitis on MRI.  We were asked to see. She is not on blood thinners. Notes 3 prior c-sections. No other abdominal surgeries. She is currently NPO.   ROS: Review of Systems  Constitutional: Positive for chills and fever.  Respiratory: Negative for cough and shortness of breath.   Cardiovascular: Negative for chest pain.  Gastrointestinal: Positive for abdominal pain, constipation and nausea. Negative for diarrhea and vomiting.  Genitourinary: Negative for dysuria and urgency.  All other systems reviewed and are negative.   Family History  Problem Relation Age of Onset  . Bipolar disorder Mother   . Hypothyroidism Mother   . Hypertension Father   . Cirrhosis Father   . Colon cancer Maternal Grandmother        Early 42s  . Colon polyps Maternal Grandmother   . Diabetes Maternal Grandmother   . Cirrhosis Paternal Grandmother   . Other Neg Hx     Past Medical History:  Diagnosis Date  . Anxiety   . Chronic headaches   . Complication of anesthesia    Reportedly severe itching  following cesarean section and prior knee surgery  . Heartburn in pregnancy   . History of cesarean section 06/10/2012  . Postpartum depression     Past Surgical History:  Procedure Laterality Date  . CESAREAN SECTION     x 3  . CESAREAN SECTION  06/09/2012   Procedure: CESAREAN SECTION;  Surgeon: Lenoard Aden, MD;  Location: [facility];  Service: Gynecology;  Laterality: N/A;  EDD: 06/15/12, repeat  . KNEE ARTHROSCOPY Left 2011  . TONSILLECTOMY      Social History:  reports that she has never smoked. She has never used smokeless tobacco. She reports that she does not drink alcohol or use drugs. Lives at home with partner and children  Allergies:  Allergies  Allergen Reactions  . Oxycodone Itching    Particularly severe itching not responding to oral Benadryl.  Hydrocodone seems to be OK  . Latex Rash    Medications Prior to Admission  Medication Sig Dispense Refill  . calcium carbonate (TUMS - DOSED IN MG ELEMENTAL CALCIUM) 500 MG chewable tablet Chew 1 tablet by mouth daily.       Physical Exam: Blood pressure 116/71, pulse (!) 101, temperature 98 F (36.7 C), temperature source Oral, resp. rate 18, weight 100.4 kg, SpO2 98 %. General: pleasant, WD/WN white female who is laying in bed in NAD HEENT: head is normocephalic, atraumatic.  Sclera are noninjected.  PERRL.  Ears and nose without any  masses or lesions.  Mouth is pink and moist. Dentition fair Heart: regular, rate, and rhythm.  Normal s1,s2. No obvious murmurs, gallops, or rubs noted.  Palpable pedal pulses bilaterally  Lungs: CTAB, no wheezes, rhonchi, or rales noted.  Respiratory effort nonlabored Abd: Soft, gravid abdomen, tenderness of the RLQ without r/rg. +BS. Prior c section scar MS: no BUE/BLE edema, calves soft and nontender Skin: warm and dry with no masses, lesions, or rashes Psych: A&Ox4 with an appropriate affect Neuro: cranial nerves grossly intact, equal strength in BUE/BLE bilaterally, normal speech,  though process intact   Results for orders placed or performed during the hospital encounter of 03/11/20 (from the past 48 hour(s))  Urinalysis, Routine w reflex microscopic     Status: Abnormal   Collection Time: 03/11/20  4:21 AM  Result Value Ref Range   Color, Urine YELLOW YELLOW   APPearance CLOUDY (A) CLEAR   Specific Gravity, Urine 1.009 1.005 - 1.030   pH 6.0 5.0 - 8.0   Glucose, UA NEGATIVE NEGATIVE mg/dL   Hgb urine dipstick NEGATIVE NEGATIVE   Bilirubin Urine NEGATIVE NEGATIVE   Ketones, ur 20 (A) NEGATIVE mg/dL   Protein, ur NEGATIVE NEGATIVE mg/dL   Nitrite NEGATIVE NEGATIVE   Leukocytes,Ua TRACE (A) NEGATIVE   RBC / HPF 0-5 0 - 5 RBC/hpf   WBC, UA 6-10 0 - 5 WBC/hpf   Bacteria, UA RARE (A) NONE SEEN   Squamous Epithelial / LPF 21-50 0 - 5   Mucus PRESENT     Comment: Performed at [facility], [address], [place], [place] [address]  CBC with Differential/Platelet     Status: Abnormal   Collection Time: 03/11/20  6:42 AM  Result Value Ref Range   WBC 13.5 (H) 4.0 - 10.5 K/uL   RBC 3.75 (L) 3.87 - 5.11 MIL/uL   Hemoglobin 12.3 12.0 - 15.0 g/dL   HCT 82.8 (L) 00.3 - 49.1 %   MCV 93.6 80.0 - 100.0 fL   MCH 32.8 26.0 - 34.0 pg   MCHC 35.0 30.0 - 36.0 g/dL   RDW 79.1 50.5 - 69.7 %   Platelets 191 150 - 400 K/uL   nRBC 0.0 0.0 - 0.2 %   Neutrophils Relative % 78 %   Neutro Abs 10.6 (H) 1.7 - 7.7 K/uL   Lymphocytes Relative 15 %   Lymphs Abs 2.0 0.7 - 4.0 K/uL   Monocytes Relative 6 %   Monocytes Absolute 0.7 0.1 - 1.0 K/uL   Eosinophils Relative 0 %   Eosinophils Absolute 0.0 0.0 - 0.5 K/uL   Basophils Relative 0 %   Basophils Absolute 0.0 0.0 - 0.1 K/uL   Immature Granulocytes 1 %   Abs Immature Granulocytes 0.08 (H) 0.00 - 0.07 K/uL    Comment: Performed at [facility], [address], [place], [place] [address]  Comprehensive metabolic panel     Status: Abnormal   Collection Time: 03/11/20  6:42 AM  Result Value Ref Range   Sodium 137  135 - 145 mmol/L   Potassium 3.2 (L) 3.5 - 5.1 mmol/L   Chloride 105 98 - 111 mmol/L   CO2 20 (L) 22 - 32 mmol/L   Glucose, Bld 129 (H) 70 - 99 mg/dL    Comment: Glucose reference range applies only to samples taken after fasting for at least 8 hours.   BUN <5 (L) 6 - 20 mg/dL   Creatinine, Ser 6.55 0.44 - 1.00 mg/dL  Calcium 9.2 8.9 - 10.3 mg/dL   Total Protein 6.1 (L) 6.5 - 8.1 g/dL   Albumin 3.0 (L) 3.5 - 5.0 g/dL   AST 13 (L) 15 - 41 U/L   ALT 11 0 - 44 U/L   Alkaline Phosphatase 54 38 - 126 U/L   Total Bilirubin 0.7 0.3 - 1.2 mg/dL   GFR calc non Af Amer >60 >60 mL/min   GFR calc Af Amer >60 >60 mL/min   Anion gap 12 5 - 15    Comment: Performed at [facility], [address], [place], [place] [address]  Amylase     Status: None   Collection Time: 03/11/20  6:42 AM  Result Value Ref Range   Amylase 32 28 - 100 U/L    Comment: Performed at [facility], [address] [place], [place] [address]  Lipase, blood     Status: None   Collection Time: 03/11/20  6:42 AM  Result Value Ref Range   Lipase 24 11 - 51 U/L    Comment: Performed at [facility], [address], [place], [place] [address]   MR PELVIS WO CONTRAST  Result Date: 03/11/2020 CLINICAL DATA:  Right lower quadrant pain.  [redacted] weeks gestation. EXAM: MRI ABDOMEN AND PELVIS WITHOUT CONTRAST TECHNIQUE: Multiplanar multisequence MR imaging of the abdomen and pelvis was performed. No intravenous contrast was administered. COMPARISON:  None. Abdomen/pelvis CT 03/01/2017 FINDINGS: COMBINED FINDINGS FOR BOTH MR ABDOMEN AND PELVIS Lower chest: Unremarkable. Hepatobiliary: Hepatic dome has not been included in the field of view on all pulse sequences. Within this limitation, liver is unremarkable. There is no evidence for gallstones, gallbladder wall thickening, or pericholecystic fluid. No intrahepatic or extrahepatic biliary dilation. Pancreas: No focal mass lesion. No dilatation of the main duct. No  intraparenchymal cyst. No peripancreatic edema. Spleen:  No splenomegaly. No focal mass lesion. Adrenals/Urinary Tract: No adrenal nodule or mass. Kidneys unremarkable. No hydroureteronephrosis. Bladder is nondistended. Stomach/Bowel: Stomach is unremarkable. No gastric wall thickening. No evidence of outlet obstruction. Duodenum is normally positioned as is the ligament of Treitz. No small bowel wall thickening. No small bowel dilatation. Cecum is identified in the lateral aspect of the right mid abdomen. The appendix is visible on coronal T2 haste without fat suppression (images 21-26 of series 3 between the cecum in the right uterine fundus). There is a tiny collection of fluid medial to the appendiceal tip on image 26 of series 3. Appendix also visible on axial haste imaging with fluid signal intensity in the lumen and appendiceal diameter measured at 7 mm. Although the periappendiceal fat does not appear markedly abnormal on the non fat suppressed T2 images, T2 imaging with fat suppression suggests abnormal increased signal in the periappendiceal fat (see axial T2 image 34 of series 13 and coronal fat suppressed haste images 22 and 23 of series 4). There is no evidence for right lower quadrant abscess. Trace amount of fluid is seen around the cecal tip extending down along the right aspect of the uterus. Vascular/Lymphatic: No abdominal aortic aneurysm. No abdominal lymphadenopathy. No pelvic lymphadenopathy Reproductive: Single intrauterine gestation identified. Left ovary is normal in appearance. Right ovary not well seen but no right adnexal mass. Other:  No evidence for intraperitoneal abscess. Musculoskeletal: No abnormal marrow signal within the visualized bony anatomy. IMPRESSION: 1. Appendix is visualized between the cecum and the right aspect of the upper uterus, mildly distended at 7 mm diameter with fluid signal in the  lumen. There is a tiny fluid collection adjacent to the tip of the appendix and  although the fat signal is not substantially abnormal on non-fat suppressed imaging, T2 imaging with fat suppression suggests that there is some increased signal intensity around the appendix, raising concern for subtle or mild periappendiceal inflammation. While not definite, given the mild appendiceal distension and apparent subtle increased signal intensity around the appendix on fat suppressed T2 imaging, acute appendicitis cannot be excluded on this study. 2. No evidence for gross intraperitoneal free fluid. No pelvic abscess evident by noncontrast MRI. Electronically Signed   By: Misty Stanley M.D.   On: 03/11/2020 10:01   MR ABDOMEN WO CONTRAST  Result Date: 03/11/2020 CLINICAL DATA:  Right lower quadrant pain.  [redacted] weeks gestation. EXAM: MRI ABDOMEN AND PELVIS WITHOUT CONTRAST TECHNIQUE: Multiplanar multisequence MR imaging of the abdomen and pelvis was performed. No intravenous contrast was administered. COMPARISON:  None. Abdomen/pelvis CT 03/01/2017 FINDINGS: COMBINED FINDINGS FOR BOTH MR ABDOMEN AND PELVIS Lower chest: Unremarkable. Hepatobiliary: Hepatic dome has not been included in the field of view on all pulse sequences. Within this limitation, liver is unremarkable. There is no evidence for gallstones, gallbladder wall thickening, or pericholecystic fluid. No intrahepatic or extrahepatic biliary dilation. Pancreas: No focal mass lesion. No dilatation of the main duct. No intraparenchymal cyst. No peripancreatic edema. Spleen:  No splenomegaly. No focal mass lesion. Adrenals/Urinary Tract: No adrenal nodule or mass. Kidneys unremarkable. No hydroureteronephrosis. Bladder is nondistended. Stomach/Bowel: Stomach is unremarkable. No gastric wall thickening. No evidence of outlet obstruction. Duodenum is normally positioned as is the ligament of Treitz. No small bowel wall thickening. No small bowel dilatation. Cecum is identified in the lateral aspect of the right mid abdomen. The appendix is visible  on coronal T2 haste without fat suppression (images 21-26 of series 3 between the cecum in the right uterine fundus). There is a tiny collection of fluid medial to the appendiceal tip on image 26 of series 3. Appendix also visible on axial haste imaging with fluid signal intensity in the lumen and appendiceal diameter measured at 7 mm. Although the periappendiceal fat does not appear markedly abnormal on the non fat suppressed T2 images, T2 imaging with fat suppression suggests abnormal increased signal in the periappendiceal fat (see axial T2 image 34 of series 13 and coronal fat suppressed haste images 22 and 23 of series 4). There is no evidence for right lower quadrant abscess. Trace amount of fluid is seen around the cecal tip extending down along the right aspect of the uterus. Vascular/Lymphatic: No abdominal aortic aneurysm. No abdominal lymphadenopathy. No pelvic lymphadenopathy Reproductive: Single intrauterine gestation identified. Left ovary is normal in appearance. Right ovary not well seen but no right adnexal mass. Other:  No evidence for intraperitoneal abscess. Musculoskeletal: No abnormal marrow signal within the visualized bony anatomy. IMPRESSION: 1. Appendix is visualized between the cecum and the right aspect of the upper uterus, mildly distended at 7 mm diameter with fluid signal in the lumen. There is a tiny fluid collection adjacent to the tip of the appendix and although the fat signal is not substantially abnormal on non-fat suppressed imaging, T2 imaging with fat suppression suggests that there is some increased signal intensity around the appendix, raising concern for subtle or mild periappendiceal inflammation. While not definite, given the mild appendiceal distension and apparent subtle increased signal intensity around the appendix on fat suppressed T2 imaging, acute appendicitis cannot be excluded on this study. 2. No evidence for gross  intraperitoneal free fluid. No pelvic abscess  evident by noncontrast MRI. Electronically Signed   By: Kennith CenterEric  Mansell M.D.   On: 03/11/2020 10:01    Anti-infectives (From admission, onward)   None      Assessment/Plan [id number] Pregnant - Per OB  Acute Appendicitis - Will plan for OR today for laparoscopic appendectomy with Dr. Dwain [place]Wakefield - NPO, IV abx - Admit to OB  FEN - NPO, IVF VTE - SCDs ID - Rocephin  Jacinto HalimMichael M Loxley Cibrian, [facility] 03/11/2020, 11:29 AM Please see Amion for pager number during day hours 7:00am-4:30pm

## 2020-03-11 NOTE — Anesthesia Procedure Notes (Signed)
Procedure Name: Intubation Date/Time: 03/11/2020 2:28 PM Performed by: Kyung Rudd, CRNA Pre-anesthesia Checklist: Patient identified, Emergency Drugs available, Suction available and Patient being monitored Patient Re-evaluated:Patient Re-evaluated prior to induction Oxygen Delivery Method: Circle system utilized Preoxygenation: Pre-oxygenation with 100% oxygen Induction Type: IV induction and Rapid sequence Ventilation: Mask ventilation without difficulty Laryngoscope Size: Mac and 3 Grade View: Grade II Tube type: Oral Tube size: 7.0 mm Number of attempts: 1 Airway Equipment and Method: Stylet Placement Confirmation: ETT inserted through vocal cords under direct vision and breath sounds checked- equal and bilateral Secured at: 21 cm Tube secured with: Tape Dental Injury: Teeth and Oropharynx as per pre-operative assessment

## 2020-03-11 NOTE — Discharge Instructions (Signed)
[facility]  Please arrive at least 30 min before your appointment to complete your check in paperwork.  If you are unable to arrive 30 min prior to your appointment time we may have to cancel or reschedule you. LAPAROSCOPIC SURGERY: POST OP INSTRUCTIONS Always review your discharge instruction sheet given to you by the facility where your surgery was performed. IF YOU HAVE DISABILITY OR FAMILY LEAVE FORMS, YOU MUST BRING THEM TO THE OFFICE FOR PROCESSING.   DO NOT GIVE THEM TO YOUR DOCTOR.  PAIN CONTROL  1. First take acetaminophen (Tylenol) AND/or ibuprofen (Advil) to control your pain after surgery.  Follow directions on package.  Taking acetaminophen (Tylenol) and/or ibuprofen (Advil) regularly after surgery will help to control your pain and lower the amount of prescription pain medication you may need.  You should not take more than 4,000 mg (4 grams) of acetaminophen (Tylenol) in 24 hours.  You should not take ibuprofen (Advil), aleve, motrin, naprosyn or other NSAIDS if you have a history of stomach ulcers or chronic kidney disease.  2. A prescription for pain medication may be given to you upon discharge.  Take your pain medication as prescribed, if you still have uncontrolled pain after taking acetaminophen (Tylenol) or ibuprofen (Advil). 3. Use ice packs to help control pain. 4. If you need a refill on your pain medication, please contact your pharmacy.  They will contact our office to request authorization. Prescriptions will not be filled after 5pm or on week-ends.  HOME MEDICATIONS 5. Take your usually prescribed medications unless otherwise directed.  DIET 6. You should follow a light diet the first few days after arrival home.  Be sure to include lots of fluids daily. Avoid fatty, fried foods.   CONSTIPATION 7. It is common to experience some constipation after surgery and if you are taking pain medication.  Increasing fluid intake and taking a stool  softener (such as Colace) will usually help or prevent this problem from occurring.  A mild laxative (Milk of Magnesia or Miralax) should be taken according to package instructions if there are no bowel movements after 48 hours.  WOUND/INCISION CARE 8. Most patients will experience some swelling and bruising in the area of the incisions.  Ice packs will help.  Swelling and bruising can take several days to resolve.  9. Unless discharge instructions indicate otherwise, follow guidelines below  a. STERI-STRIPS - you may remove your outer bandages 48 hours after surgery, and you may shower at that time.  You have steri-strips (small skin tapes) in place directly over the incision.  These strips should be left on the skin for 7-10 days.   b. DERMABOND/SKIN GLUE - you may shower in 24 hours.  The glue will flake off over the next 2-3 weeks. 10. Any sutures or staples will be removed at the office during your follow-up visit.  ACTIVITIES 11. You may resume regular (light) daily activities beginning the next day--such as daily self-care, walking, climbing stairs--gradually increasing activities as tolerated.  You may have sexual intercourse when it is comfortable.  Refrain from any heavy lifting or straining until approved by your doctor. a. You may drive when you are no longer taking prescription pain medication, you can comfortably wear a seatbelt, and you can safely maneuver your car and apply brakes.  FOLLOW-UP 12. You should see your doctor in the office for a follow-up appointment approximately 2-3 weeks after your surgery.  You should have been given your post-op/follow-up appointment when   your surgery was scheduled.  If you did not receive a post-op/follow-up appointment, make sure that you call for this appointment within a day or two after you arrive home to insure a convenient appointment time.   WHEN TO CALL YOUR DOCTOR: 1. Fever over 101.0 2. Inability to urinate 3. Continued bleeding from  incision. 4. Increased pain, redness, or drainage from the incision. 5. Increasing abdominal pain  The clinic staff is available to answer your questions during regular business hours.  Please don't hesitate to call and ask to speak to one of the nurses for clinical concerns.  If you have a medical emergency, go to the nearest emergency room or call 911.  A surgeon from [facility] is always on call at the hospital. [address], [address], [place], [place]  [address] ? [address] [address], [place], [place]   [address] [phone] ? [phone] ? FAX [phone]  .........   Managing Your Pain After Surgery Without Opioids    Thank you for participating in our program to help patients manage their pain after surgery without opioids. This is part of our effort to provide you with the best care possible, without exposing you or your family to the risk that opioids pose.  What pain can I expect after surgery? You can expect to have some pain after surgery. This is normal. The pain is typically worse the day after surgery, and quickly begins to get better. Many studies have found that many patients are able to manage their pain after surgery with Over-the-Counter (OTC) medications such as Tylenol and Motrin. If you have a condition that does not allow you to take Tylenol or Motrin, notify your surgical team.  How will I manage my pain? The best strategy for controlling your pain after surgery is around the clock pain control with Tylenol (acetaminophen) and Motrin (ibuprofen or Advil). Alternating these medications with each other allows you to maximize your pain control. In addition to Tylenol and Motrin, you can use heating pads or ice packs on your incisions to help reduce your pain.  How will I alternate your regular strength over-the-counter pain medication? You will take a dose of pain medication every three hours. ; Start by taking 650 mg of Tylenol (2 pills of 325  mg) ; 3 hours later take 600 mg of Motrin (3 pills of 200 mg) ; 3 hours after taking the Motrin take 650 mg of Tylenol ; 3 hours after that take 600 mg of Motrin.   - 1 -  See example - if your first dose of Tylenol is at 12:00 PM   12:00 PM Tylenol 650 mg (2 pills of 325 mg)  3:00 PM Motrin 600 mg (3 pills of 200 mg)  6:00 PM Tylenol 650 mg (2 pills of 325 mg)  9:00 PM Motrin 600 mg (3 pills of 200 mg)  Continue alternating every 3 hours   We recommend that you follow this schedule around-the-clock for at least 3 days after surgery, or until you feel that it is no longer needed. Use the table on the last page of this handout to keep track of the medications you are taking. Important: Do not take more than 3000mg of Tylenol or 3200mg of Motrin in a 24-hour period. Do not take ibuprofen/Motrin if you have a history of bleeding stomach ulcers, severe kidney disease, &/or actively taking a blood thinner  What if I still have pain? If you have pain that is not   controlled with the over-the-counter pain medications (Tylenol and Motrin or Advil) you might have what we call "breakthrough" pain. You will receive a prescription for a small amount of an opioid pain medication such as Oxycodone, Tramadol, or Tylenol with Codeine. Use these opioid pills in the first 24 hours after surgery if you have breakthrough pain. Do not take more than 1 pill every 4-6 hours.  If you still have uncontrolled pain after using all opioid pills, don't hesitate to call our staff using the number provided. We will help make sure you are managing your pain in the best way possible, and if necessary, we can provide a prescription for additional pain medication.   Day 1    Time  Name of Medication Number of pills taken  Amount of Acetaminophen  Pain Level   Comments  AM PM       AM PM       AM PM       AM PM       AM PM       AM PM       AM PM       AM PM       Total Daily amount of Acetaminophen Do not  take more than  3,000 mg per day      Day 2    Time  Name of Medication Number of pills taken  Amount of Acetaminophen  Pain Level   Comments  AM PM       AM PM       AM PM       AM PM       AM PM       AM PM       AM PM       AM PM       Total Daily amount of Acetaminophen Do not take more than  3,000 mg per day      Day 3    Time  Name of Medication Number of pills taken  Amount of Acetaminophen  Pain Level   Comments  AM PM       AM PM       AM PM       AM PM          AM PM       AM PM       AM PM       AM PM       Total Daily amount of Acetaminophen Do not take more than  3,000 mg per day      Day 4    Time  Name of Medication Number of pills taken  Amount of Acetaminophen  Pain Level   Comments  AM PM       AM PM       AM PM       AM PM       AM PM       AM PM       AM PM       AM PM       Total Daily amount of Acetaminophen Do not take more than  3,000 mg per day      Day 5    Time  Name of Medication Number of pills taken  Amount of Acetaminophen  Pain Level   Comments  AM PM       AM PM       AM   PM       AM PM       AM PM       AM PM       AM PM       AM PM       Total Daily amount of Acetaminophen Do not take more than  3,000 mg per day       Day 6    Time  Name of Medication Number of pills taken  Amount of Acetaminophen  Pain Level  Comments  AM PM       AM PM       AM PM       AM PM       AM PM       AM PM       AM PM       AM PM       Total Daily amount of Acetaminophen Do not take more than  3,000 mg per day      Day 7    Time  Name of Medication Number of pills taken  Amount of Acetaminophen  Pain Level   Comments  AM PM       AM PM       AM PM       AM PM       AM PM       AM PM       AM PM       AM PM       Total Daily amount of Acetaminophen Do not take more than  3,000 mg per day        For additional information about how and where to safely dispose of unused  opioid medications - https://www.morepowerfulnc.org  Disclaimer: This document contains information and/or instructional materials adapted from [facility] for the typical patient with your condition. It does not replace medical advice from your health care provider because your experience may differ from that of the typical patient. Talk to your health care provider if you have any questions about this document, your condition or your treatment plan. Adapted from [facility]   

## 2020-03-11 NOTE — MAU Note (Signed)
Report given to [facility] short stay RN.

## 2020-03-11 NOTE — MAU Note (Signed)
Patient has been having abdominal pain since yesterday afternoon that has just been progressively getting worse with time.  Took tums to see if it was indigestion and it did not help.  States the pain is in her lower abdomen, mostly on the right side that wraps around to her back.  Denies and hx of kidney stones or S/S of UTI.  Denies VB/LOF.  Last movement felt around 2300.

## 2020-03-11 NOTE — Transfer of Care (Signed)
Immediate Anesthesia Transfer of Care Note  Patient: Joanne Mcguire  Procedure(s) Performed: APPENDECTOMY LAPAROSCOPIC (N/A )  Patient Location: PACU  Anesthesia Type:General  Level of Consciousness: awake, alert , oriented and patient cooperative  Airway & Oxygen Therapy: Patient Spontanous Breathing  Post-op Assessment: Report given to RN and Post -op Vital signs reviewed and stable HR 122 - continues to be tachycardic as in preop.  Rapid response OB RN alerted to check fetal heart tones post anesthesia  Post vital signs: Reviewed and stable  Last Vitals:  Vitals Value Taken Time  BP 106/51 03/11/20 1529  Temp    Pulse 119 03/11/20 1530  Resp 17 03/11/20 1530  SpO2 94 % 03/11/20 1530  Vitals shown include unvalidated device data.  Last Pain:  Vitals:   03/11/20 1158  TempSrc:   PainSc: 7          Complications: No apparent anesthesia complications

## 2020-03-11 NOTE — Anesthesia Preprocedure Evaluation (Addendum)
Anesthesia Evaluation  Patient identified by MRN, date of birth, ID band Patient awake    Reviewed: Allergy & Precautions, NPO status , Patient's Chart, lab work & pertinent test results  Airway Mallampati: I  TM Distance: >3 FB Neck ROM: Full    Dental   Pulmonary    Pulmonary exam normal        Cardiovascular Normal cardiovascular exam     Neuro/Psych Anxiety Depression    GI/Hepatic   Endo/Other    Renal/GU      Musculoskeletal   Abdominal   Peds  Hematology   Anesthesia Other Findings   Reproductive/Obstetrics                             Anesthesia Physical Anesthesia Plan  ASA: III and emergent  Anesthesia Plan: General   Post-op Pain Management:    Induction: Intravenous, Rapid sequence and Cricoid pressure planned  PONV Risk Score and Plan: 3 and Ondansetron and Dexamethasone  Airway Management Planned: Oral ETT  Additional Equipment:   Intra-op Plan:   Post-operative Plan: Extubation in OR  Informed Consent: I have reviewed the patients History and Physical, chart, labs and discussed the procedure including the risks, benefits and alternatives for the proposed anesthesia with the patient or authorized representative who has indicated his/her understanding and acceptance.       Plan Discussed with: CRNA and Surgeon  Anesthesia Plan Comments: (Pt [redacted] weeks pregnant. Doppler fetal heart rate 144 per OB rapid response)       Anesthesia Quick Evaluation

## 2020-03-12 LAB — SURGICAL PATHOLOGY

## 2020-03-12 NOTE — Plan of Care (Signed)
Pt will be discharged with printed instructions. Carmelina Dane, RN

## 2020-03-12 NOTE — Progress Notes (Signed)
1 Day Post-Op   Subjective/Chief Complaint: Doing well, tol diet, no n/v, voiding   Objective: Vital signs in last 24 hours: Temp:  [97.7 F (36.5 C)-98.3 F (36.8 C)] 98 F (36.7 C) (05/05 0419) Pulse Rate:  [38-122] 96 (05/05 0419) Resp:  [9-19] 19 (05/05 0419) BP: (95-112)/(43-73) 112/51 (05/05 0419) SpO2:  [93 %-100 %] 100 % (05/05 0419) Weight:  [100.4 kg] 100.4 kg (05/04 1642) Last BM Date: 03/09/20  Intake/Output from previous day: 05/04 0701 - 05/05 0700 In: 1415.2 [P.O.:200; I.V.:1115.2; IV Piggyback:100] Out: 310 [Urine:300; Blood:10] Intake/Output this shift: No intake/output data recorded.  GI: approp tender incisions clean  Lab Results:  Recent Labs    03/11/20 0642  WBC 13.5*  HGB 12.3  HCT 35.1*  PLT 191   BMET Recent Labs    03/11/20 0642  NA 137  K 3.2*  CL 105  CO2 20*  GLUCOSE 129*  BUN <5*  CREATININE 0.57  CALCIUM 9.2   PT/INR No results for input(s): LABPROT, INR in the last 72 hours. ABG No results for input(s): PHART, HCO3 in the last 72 hours.  Invalid input(s): PCO2, PO2  Studies/Results: MR PELVIS WO CONTRAST  Result Date: 03/11/2020 CLINICAL DATA:  Right lower quadrant pain.  [redacted] weeks gestation. EXAM: MRI ABDOMEN AND PELVIS WITHOUT CONTRAST TECHNIQUE: Multiplanar multisequence MR imaging of the abdomen and pelvis was performed. No intravenous contrast was administered. COMPARISON:  None. Abdomen/pelvis CT 03/01/2017 FINDINGS: COMBINED FINDINGS FOR BOTH MR ABDOMEN AND PELVIS Lower chest: Unremarkable. Hepatobiliary: Hepatic dome has not been included in the field of view on all pulse sequences. Within this limitation, liver is unremarkable. There is no evidence for gallstones, gallbladder wall thickening, or pericholecystic fluid. No intrahepatic or extrahepatic biliary dilation. Pancreas: No focal mass lesion. No dilatation of the main duct. No intraparenchymal cyst. No peripancreatic edema. Spleen:  No splenomegaly. No focal mass  lesion. Adrenals/Urinary Tract: No adrenal nodule or mass. Kidneys unremarkable. No hydroureteronephrosis. Bladder is nondistended. Stomach/Bowel: Stomach is unremarkable. No gastric wall thickening. No evidence of outlet obstruction. Duodenum is normally positioned as is the ligament of Treitz. No small bowel wall thickening. No small bowel dilatation. Cecum is identified in the lateral aspect of the right mid abdomen. The appendix is visible on coronal T2 haste without fat suppression (images 21-26 of series 3 between the cecum in the right uterine fundus). There is a tiny collection of fluid medial to the appendiceal tip on image 26 of series 3. Appendix also visible on axial haste imaging with fluid signal intensity in the lumen and appendiceal diameter measured at 7 mm. Although the periappendiceal fat does not appear markedly abnormal on the non fat suppressed T2 images, T2 imaging with fat suppression suggests abnormal increased signal in the periappendiceal fat (see axial T2 image 34 of series 13 and coronal fat suppressed haste images 22 and 23 of series 4). There is no evidence for right lower quadrant abscess. Trace amount of fluid is seen around the cecal tip extending down along the right aspect of the uterus. Vascular/Lymphatic: No abdominal aortic aneurysm. No abdominal lymphadenopathy. No pelvic lymphadenopathy Reproductive: Single intrauterine gestation identified. Left ovary is normal in appearance. Right ovary not well seen but no right adnexal mass. Other:  No evidence for intraperitoneal abscess. Musculoskeletal: No abnormal marrow signal within the visualized bony anatomy. IMPRESSION: 1. Appendix is visualized between the cecum and the right aspect of the upper uterus, mildly distended at 7 mm diameter with fluid signal in  the lumen. There is a tiny fluid collection adjacent to the tip of the appendix and although the fat signal is not substantially abnormal on non-fat suppressed imaging, T2  imaging with fat suppression suggests that there is some increased signal intensity around the appendix, raising concern for subtle or mild periappendiceal inflammation. While not definite, given the mild appendiceal distension and apparent subtle increased signal intensity around the appendix on fat suppressed T2 imaging, acute appendicitis cannot be excluded on this study. 2. No evidence for gross intraperitoneal free fluid. No pelvic abscess evident by noncontrast MRI. Electronically Signed   By: Misty Stanley M.D.   On: 03/11/2020 10:01   MR ABDOMEN WO CONTRAST  Result Date: 03/11/2020 CLINICAL DATA:  Right lower quadrant pain.  [redacted] weeks gestation. EXAM: MRI ABDOMEN AND PELVIS WITHOUT CONTRAST TECHNIQUE: Multiplanar multisequence MR imaging of the abdomen and pelvis was performed. No intravenous contrast was administered. COMPARISON:  None. Abdomen/pelvis CT 03/01/2017 FINDINGS: COMBINED FINDINGS FOR BOTH MR ABDOMEN AND PELVIS Lower chest: Unremarkable. Hepatobiliary: Hepatic dome has not been included in the field of view on all pulse sequences. Within this limitation, liver is unremarkable. There is no evidence for gallstones, gallbladder wall thickening, or pericholecystic fluid. No intrahepatic or extrahepatic biliary dilation. Pancreas: No focal mass lesion. No dilatation of the main duct. No intraparenchymal cyst. No peripancreatic edema. Spleen:  No splenomegaly. No focal mass lesion. Adrenals/Urinary Tract: No adrenal nodule or mass. Kidneys unremarkable. No hydroureteronephrosis. Bladder is nondistended. Stomach/Bowel: Stomach is unremarkable. No gastric wall thickening. No evidence of outlet obstruction. Duodenum is normally positioned as is the ligament of Treitz. No small bowel wall thickening. No small bowel dilatation. Cecum is identified in the lateral aspect of the right mid abdomen. The appendix is visible on coronal T2 haste without fat suppression (images 21-26 of series 3 between the cecum  in the right uterine fundus). There is a tiny collection of fluid medial to the appendiceal tip on image 26 of series 3. Appendix also visible on axial haste imaging with fluid signal intensity in the lumen and appendiceal diameter measured at 7 mm. Although the periappendiceal fat does not appear markedly abnormal on the non fat suppressed T2 images, T2 imaging with fat suppression suggests abnormal increased signal in the periappendiceal fat (see axial T2 image 34 of series 13 and coronal fat suppressed haste images 22 and 23 of series 4). There is no evidence for right lower quadrant abscess. Trace amount of fluid is seen around the cecal tip extending down along the right aspect of the uterus. Vascular/Lymphatic: No abdominal aortic aneurysm. No abdominal lymphadenopathy. No pelvic lymphadenopathy Reproductive: Single intrauterine gestation identified. Left ovary is normal in appearance. Right ovary not well seen but no right adnexal mass. Other:  No evidence for intraperitoneal abscess. Musculoskeletal: No abnormal marrow signal within the visualized bony anatomy. IMPRESSION: 1. Appendix is visualized between the cecum and the right aspect of the upper uterus, mildly distended at 7 mm diameter with fluid signal in the lumen. There is a tiny fluid collection adjacent to the tip of the appendix and although the fat signal is not substantially abnormal on non-fat suppressed imaging, T2 imaging with fat suppression suggests that there is some increased signal intensity around the appendix, raising concern for subtle or mild periappendiceal inflammation. While not definite, given the mild appendiceal distension and apparent subtle increased signal intensity around the appendix on fat suppressed T2 imaging, acute appendicitis cannot be excluded on this study. 2. No evidence for  gross intraperitoneal free fluid. No pelvic abscess evident by noncontrast MRI. Electronically Signed   By: Kennith Center M.D.   On:  03/11/2020 10:01    Anti-infectives: Anti-infectives (From admission, onward)   Start     Dose/Rate Route Frequency Ordered Stop   03/11/20 1200  cefTRIAXone (ROCEPHIN) 2 g in sodium chloride 0.9 % 100 mL IVPB  Status:  Discontinued     2 g 200 mL/hr over 30 Minutes Intravenous Every 24 hours 03/11/20 1143 03/11/20 1617      Assessment/Plan: POD 1 lap appy for acute appendicitis -can dc home today from surgery standpoint -no further abx for appendicitis -will see back in two to three weeks -path pending -did fine on tylenol so can just take that at home  Emelia Loron 03/12/2020

## 2020-03-12 NOTE — MAU Provider Note (Signed)
History     CSN: [id number]  Arrival date and time: 03/11/20 5366   First Provider Initiated Contact with Patient 03/11/20 0522      Chief Complaint  Patient presents with  . Abdominal Pain   Ms. Joanne Mcguire is a 39 y.o. [phone] at [redacted]w[redacted]d who presents to MAU for RLQ abdominal pain.  Onset: 4pm yesterday afternoon Location: RLQ primary that radiates to entire stomach Duration: <24hrs Character: constant, feels like she needs to have a bowel movement, "it just really hurts" Aggravating/Associated: worse with movement/nausea with vomiting, but feels like she needs to, chills, low grade fever Relieving: sitting up, standing up Treatment: TUMS Severity: 10/10 at times  Pt denies VB, LOF, ctx, vaginal discharge/odor/itching. Pt denies constipation, diarrhea, or urinary problems. Pt denies fatigue, sweating or changes in appetite. Pt denies SOB or chest pain. Pt denies dizziness, HA, light-headedness, weakness.  Problems this pregnancy include: hx of uterine rupture?Marland Kitchen Allergies? Oxycodone, latex Current medications/supplements? none Prenatal care provider? [facility], next appt 03/21/2020  Abdominal Pain Associated symptoms include a fever, nausea and vomiting. Pertinent negatives include no constipation, diarrhea, dysuria, frequency or headaches.    OB History    Gravida  4   Para  3   Term  2   Preterm  1   AB      Living  3     SAB      TAB      Ectopic      Multiple      Live Births  1           Past Medical History:  Diagnosis Date  . Anxiety   . Chronic headaches   . Complication of anesthesia    Reportedly severe itching following cesarean section and prior knee surgery  . Heartburn in pregnancy   . History of cesarean section 06/10/2012  . Postpartum depression     Past Surgical History:  Procedure Laterality Date  . CESAREAN SECTION     x 3  . CESAREAN SECTION  06/09/2012   Procedure: CESAREAN SECTION;  Surgeon: Lenoard Aden,  MD;  Location: [facility];  Service: Gynecology;  Laterality: N/A;  EDD: 06/15/12, repeat  . KNEE ARTHROSCOPY Left 2011  . TONSILLECTOMY      Family History  Problem Relation Age of Onset  . Bipolar disorder Mother   . Hypothyroidism Mother   . Hypertension Father   . Cirrhosis Father   . Colon cancer Maternal Grandmother        Early 22s  . Colon polyps Maternal Grandmother   . Diabetes Maternal Grandmother   . Cirrhosis Paternal Grandmother   . Other Neg Hx     Social History   Tobacco Use  . Smoking status: Never Smoker  . Smokeless tobacco: Never Used  Substance Use Topics  . Alcohol use: No  . Drug use: No    Allergies:  Allergies  Allergen Reactions  . Oxycodone Itching    Particularly severe itching not responding to oral Benadryl.  Hydrocodone seems to be OK  . Latex Rash    Medications Prior to Admission  Medication Sig Dispense Refill Last Dose  . calcium carbonate (TUMS - DOSED IN MG ELEMENTAL CALCIUM) 500 MG chewable tablet Chew 1 tablet by mouth daily.   03/11/2020 at Unknown time    Review of Systems  Constitutional: Positive for chills and fever. Negative for diaphoresis and fatigue.  Eyes: Negative for visual disturbance.  Respiratory: Negative for shortness of  breath.   Cardiovascular: Negative for chest pain.  Gastrointestinal: Positive for abdominal pain, nausea and vomiting. Negative for constipation and diarrhea.  Genitourinary: Negative for dysuria, flank pain, frequency, pelvic pain, urgency, vaginal bleeding and vaginal discharge.  Neurological: Negative for dizziness, weakness, light-headedness and headaches.   Physical Exam   Blood pressure 103/60, pulse 99, temperature 98.1 F (36.7 C), temperature source Oral, resp. rate 18, weight 100.4 kg, SpO2 99 %.  Patient Vitals for the past 24 hrs:  BP Temp Temp src Pulse Resp SpO2 Weight  03/12/20 0814 103/60 98.1 F (36.7 C) Oral 99 18 99 % --  03/12/20 0419 (!) 112/51 98 F (36.7 C) Oral 96 19  100 % --  03/11/20 2304 (!) 101/48 -- -- 99 -- -- --  03/11/20 2303 (!) 101/48 97.7 F (36.5 C) Oral 95 18 96 % --  03/11/20 1939 (!) 106/57 98.3 F (36.8 C) Oral (!) 101 17 97 % --  03/11/20 1720 (!) 112/55 98.3 F (36.8 C) Oral (!) 108 15 98 % --  03/11/20 1642 -- -- -- -- -- -- 100.4 kg  03/11/20 1621 112/73 98.3 F (36.8 C) Oral (!) 109 -- 96 % --  03/11/20 1605 -- -- -- (!) 38 13 96 % --  03/11/20 1600 (!) 95/43 -- -- (!) 118 14 95 % --  03/11/20 1555 (!) 106/55 -- -- (!) 106 15 96 % --  03/11/20 1550 -- -- -- (!) 107 12 93 % --  03/11/20 1545 (!) 99/48 -- -- 93 15 97 % --  03/11/20 1540 -- -- -- (!) 119 (!) 9 98 % --  03/11/20 1535 -- -- -- (!) 117 13 97 % --  03/11/20 1530 (!) 106/51 98 F (36.7 C) -- (!) 122 15 94 % --  03/11/20 1156 111/62 98 F (36.7 C) Oral (!) 108 18 98 % --   Physical Exam  Constitutional: She is oriented to person, place, and time. She appears well-developed and well-nourished. No distress.  HENT:  Head: Normocephalic and atraumatic.  Cardiovascular: Normal rate and regular rhythm.  Respiratory: Effort normal and breath sounds normal.  GI: Soft. She exhibits no distension and no mass. There is abdominal tenderness. There is rebound and guarding. There is no CVA tenderness.  Generalized abdominal tenderness on exam, normal bowel sounds with majority of tenderness in RLQ  Genitourinary:    Vagina and uterus normal.   Musculoskeletal:        General: Normal range of motion.     Cervical back: Normal range of motion.  Neurological: She is alert and oriented to person, place, and time.  Skin: Skin is warm and dry. She is not diaphoretic.  Psychiatric: She has a normal mood and affect. Her behavior is normal. Judgment and thought content normal.  Pelvic exam deferred pending pain medication  Results for orders placed or performed during the hospital encounter of 03/11/20 (from the past 24 hour(s))  Respiratory Panel by RT PCR (Flu A&B, Covid) -  Nasopharyngeal Swab     Status: None   Collection Time: 03/11/20 11:03 AM   Specimen: Nasopharyngeal Swab  Result Value Ref Range   SARS Coronavirus 2 by RT PCR NEGATIVE NEGATIVE   Influenza A by PCR NEGATIVE NEGATIVE   Influenza B by PCR NEGATIVE NEGATIVE   MR PELVIS WO CONTRAST  Result Date: 03/11/2020 CLINICAL DATA:  Right lower quadrant pain.  [redacted] weeks gestation. EXAM: MRI ABDOMEN AND PELVIS WITHOUT CONTRAST TECHNIQUE: Multiplanar multisequence MR  imaging of the abdomen and pelvis was performed. No intravenous contrast was administered. COMPARISON:  None. Abdomen/pelvis CT 03/01/2017 FINDINGS: COMBINED FINDINGS FOR BOTH MR ABDOMEN AND PELVIS Lower chest: Unremarkable. Hepatobiliary: Hepatic dome has not been included in the field of view on all pulse sequences. Within this limitation, liver is unremarkable. There is no evidence for gallstones, gallbladder wall thickening, or pericholecystic fluid. No intrahepatic or extrahepatic biliary dilation. Pancreas: No focal mass lesion. No dilatation of the main duct. No intraparenchymal cyst. No peripancreatic edema. Spleen:  No splenomegaly. No focal mass lesion. Adrenals/Urinary Tract: No adrenal nodule or mass. Kidneys unremarkable. No hydroureteronephrosis. Bladder is nondistended. Stomach/Bowel: Stomach is unremarkable. No gastric wall thickening. No evidence of outlet obstruction. Duodenum is normally positioned as is the ligament of Treitz. No small bowel wall thickening. No small bowel dilatation. Cecum is identified in the lateral aspect of the right mid abdomen. The appendix is visible on coronal T2 haste without fat suppression (images 21-26 of series 3 between the cecum in the right uterine fundus). There is a tiny collection of fluid medial to the appendiceal tip on image 26 of series 3. Appendix also visible on axial haste imaging with fluid signal intensity in the lumen and appendiceal diameter measured at 7 mm. Although the periappendiceal fat  does not appear markedly abnormal on the non fat suppressed T2 images, T2 imaging with fat suppression suggests abnormal increased signal in the periappendiceal fat (see axial T2 image 34 of series 13 and coronal fat suppressed haste images 22 and 23 of series 4). There is no evidence for right lower quadrant abscess. Trace amount of fluid is seen around the cecal tip extending down along the right aspect of the uterus. Vascular/Lymphatic: No abdominal aortic aneurysm. No abdominal lymphadenopathy. No pelvic lymphadenopathy Reproductive: Single intrauterine gestation identified. Left ovary is normal in appearance. Right ovary not well seen but no right adnexal mass. Other:  No evidence for intraperitoneal abscess. Musculoskeletal: No abnormal marrow signal within the visualized bony anatomy. IMPRESSION: 1. Appendix is visualized between the cecum and the right aspect of the upper uterus, mildly distended at 7 mm diameter with fluid signal in the lumen. There is a tiny fluid collection adjacent to the tip of the appendix and although the fat signal is not substantially abnormal on non-fat suppressed imaging, T2 imaging with fat suppression suggests that there is some increased signal intensity around the appendix, raising concern for subtle or mild periappendiceal inflammation. While not definite, given the mild appendiceal distension and apparent subtle increased signal intensity around the appendix on fat suppressed T2 imaging, acute appendicitis cannot be excluded on this study. 2. No evidence for gross intraperitoneal free fluid. No pelvic abscess evident by noncontrast MRI. Electronically Signed   By: Misty Stanley M.D.   On: 03/11/2020 10:01   MR ABDOMEN WO CONTRAST  Result Date: 03/11/2020 CLINICAL DATA:  Right lower quadrant pain.  [redacted] weeks gestation. EXAM: MRI ABDOMEN AND PELVIS WITHOUT CONTRAST TECHNIQUE: Multiplanar multisequence MR imaging of the abdomen and pelvis was performed. No intravenous  contrast was administered. COMPARISON:  None. Abdomen/pelvis CT 03/01/2017 FINDINGS: COMBINED FINDINGS FOR BOTH MR ABDOMEN AND PELVIS Lower chest: Unremarkable. Hepatobiliary: Hepatic dome has not been included in the field of view on all pulse sequences. Within this limitation, liver is unremarkable. There is no evidence for gallstones, gallbladder wall thickening, or pericholecystic fluid. No intrahepatic or extrahepatic biliary dilation. Pancreas: No focal mass lesion. No dilatation of the main duct. No intraparenchymal cyst. No  peripancreatic edema. Spleen:  No splenomegaly. No focal mass lesion. Adrenals/Urinary Tract: No adrenal nodule or mass. Kidneys unremarkable. No hydroureteronephrosis. Bladder is nondistended. Stomach/Bowel: Stomach is unremarkable. No gastric wall thickening. No evidence of outlet obstruction. Duodenum is normally positioned as is the ligament of Treitz. No small bowel wall thickening. No small bowel dilatation. Cecum is identified in the lateral aspect of the right mid abdomen. The appendix is visible on coronal T2 haste without fat suppression (images 21-26 of series 3 between the cecum in the right uterine fundus). There is a tiny collection of fluid medial to the appendiceal tip on image 26 of series 3. Appendix also visible on axial haste imaging with fluid signal intensity in the lumen and appendiceal diameter measured at 7 mm. Although the periappendiceal fat does not appear markedly abnormal on the non fat suppressed T2 images, T2 imaging with fat suppression suggests abnormal increased signal in the periappendiceal fat (see axial T2 image 34 of series 13 and coronal fat suppressed haste images 22 and 23 of series 4). There is no evidence for right lower quadrant abscess. Trace amount of fluid is seen around the cecal tip extending down along the right aspect of the uterus. Vascular/Lymphatic: No abdominal aortic aneurysm. No abdominal lymphadenopathy. No pelvic lymphadenopathy  Reproductive: Single intrauterine gestation identified. Left ovary is normal in appearance. Right ovary not well seen but no right adnexal mass. Other:  No evidence for intraperitoneal abscess. Musculoskeletal: No abnormal marrow signal within the visualized bony anatomy. IMPRESSION: 1. Appendix is visualized between the cecum and the right aspect of the upper uterus, mildly distended at 7 mm diameter with fluid signal in the lumen. There is a tiny fluid collection adjacent to the tip of the appendix and although the fat signal is not substantially abnormal on non-fat suppressed imaging, T2 imaging with fat suppression suggests that there is some increased signal intensity around the appendix, raising concern for subtle or mild periappendiceal inflammation. While not definite, given the mild appendiceal distension and apparent subtle increased signal intensity around the appendix on fat suppressed T2 imaging, acute appendicitis cannot be excluded on this study. 2. No evidence for gross intraperitoneal free fluid. No pelvic abscess evident by noncontrast MRI. Electronically Signed   By: Kennith Center M.D.   On: 03/11/2020 10:01   MAU Course  Procedures    Assessment and Plan   1. Acute appendicitis, unspecified acute appendicitis type   2. [redacted] weeks gestation of pregnancy    Admit to OR then [facility] General surgery consulted   Olivia Mackie, MD  03/12/2020 8:32 AM

## 2020-03-12 NOTE — Anesthesia Postprocedure Evaluation (Signed)
Anesthesia Post Note  Patient: Joanne Mcguire  Procedure(s) Performed: APPENDECTOMY LAPAROSCOPIC (N/A )     Patient location during evaluation: PACU Anesthesia Type: General Level of consciousness: awake and alert Pain management: pain level controlled Vital Signs Assessment: post-procedure vital signs reviewed and stable Respiratory status: spontaneous breathing, nonlabored ventilation, respiratory function stable and patient connected to nasal cannula oxygen Cardiovascular status: blood pressure returned to baseline and stable Postop Assessment: no apparent nausea or vomiting Anesthetic complications: no    Last Vitals:  Vitals:   03/12/20 0419 03/12/20 0814  BP: (!) 112/51 103/60  Pulse: 96 99  Resp: 19 18  Temp: 36.7 C 36.7 C  SpO2: 100% 99%    Last Pain:  Vitals:   03/12/20 0814  TempSrc: Oral  PainSc: 0-No pain                 Atharv Barriere

## 2020-03-21 DIAGNOSIS — Z3A21 21 weeks gestation of pregnancy: Secondary | ICD-10-CM | POA: Diagnosis not present

## 2020-03-21 DIAGNOSIS — O99612 Diseases of the digestive system complicating pregnancy, second trimester: Secondary | ICD-10-CM | POA: Diagnosis not present

## 2020-03-21 DIAGNOSIS — Z362 Encounter for other antenatal screening follow-up: Secondary | ICD-10-CM | POA: Diagnosis not present

## 2020-03-26 IMAGING — CR DG CHEST 2V
2 series · 2 of 2 positions shown · non-contrast
Comparison: 01/21/2011

CLINICAL DATA: Shortness of breath x2 weeks, heart palpitations x2
months, pregnant

EXAM:
CHEST - 2 VIEW

[chest pa]
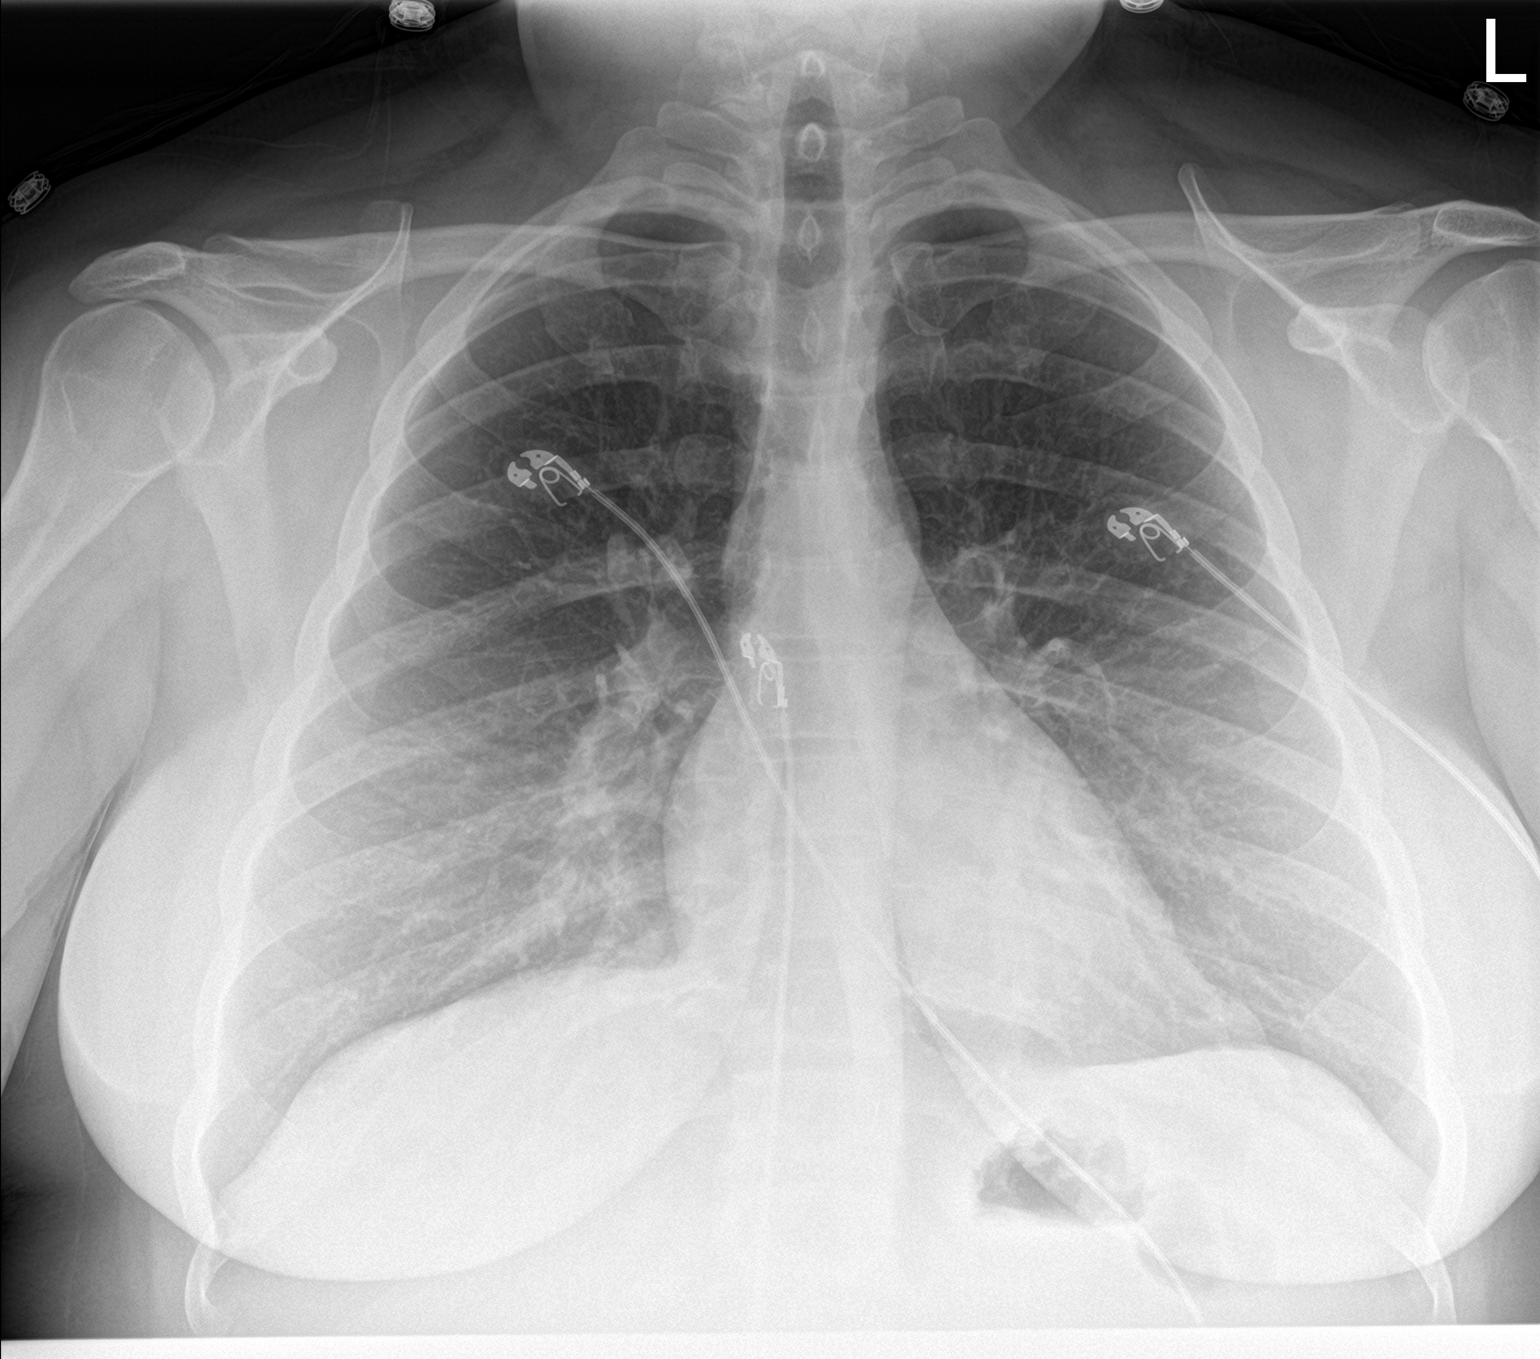

[chest lat]
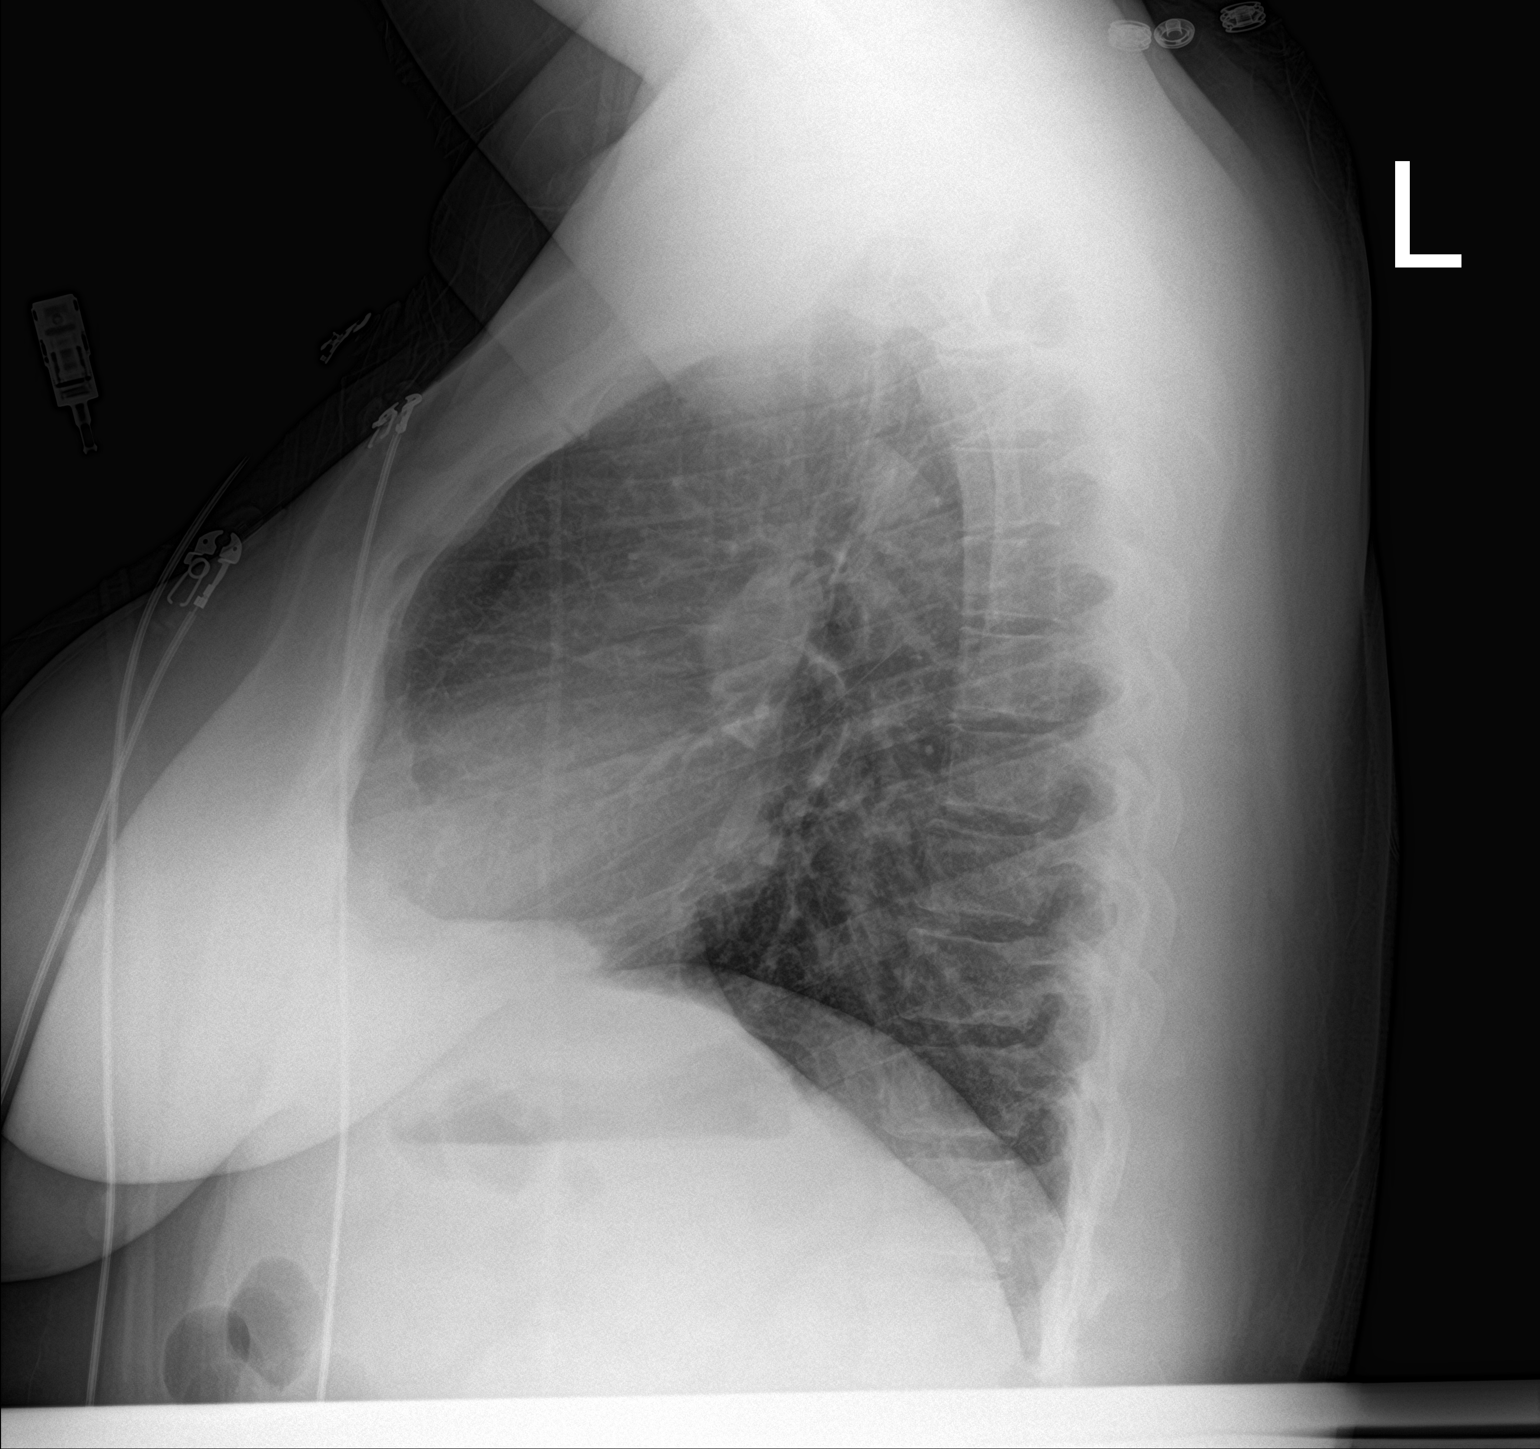

[2 of 2 positions shown; findings below may reference images not displayed]

FINDINGS: Lungs are clear.  No pleural effusion or pneumothorax.

The heart is normal in size.

Visualized osseous structures are within normal limits.
IMPRESSION: Normal chest radiographs.

## 2020-04-16 DIAGNOSIS — O99612 Diseases of the digestive system complicating pregnancy, second trimester: Secondary | ICD-10-CM | POA: Diagnosis not present

## 2020-04-16 DIAGNOSIS — Z3689 Encounter for other specified antenatal screening: Secondary | ICD-10-CM | POA: Diagnosis not present

## 2020-04-16 DIAGNOSIS — Z3A25 25 weeks gestation of pregnancy: Secondary | ICD-10-CM | POA: Diagnosis not present

## 2020-04-30 ENCOUNTER — Encounter: Payer: BC Managed Care – PPO | Attending: Obstetrics and Gynecology | Admitting: Registered"

## 2020-04-30 ENCOUNTER — Other Ambulatory Visit: Payer: Self-pay

## 2020-04-30 DIAGNOSIS — O99810 Abnormal glucose complicating pregnancy: Secondary | ICD-10-CM | POA: Diagnosis not present

## 2020-05-02 ENCOUNTER — Encounter: Payer: Self-pay | Admitting: Registered"

## 2020-05-02 DIAGNOSIS — O99810 Abnormal glucose complicating pregnancy: Secondary | ICD-10-CM | POA: Insufficient documentation

## 2020-05-02 NOTE — Progress Notes (Signed)
Patient was seen on 04/30/20 for Gestational Diabetes self-management class at the [facility]. The following learning objectives were met by the patient during this course:   States the definition of Gestational Diabetes  States why dietary management is important in controlling blood glucose  Describes the effects each nutrient has on blood glucose levels  Demonstrates ability to create a balanced meal plan  Demonstrates carbohydrate counting   States when to check blood glucose levels  Demonstrates proper blood glucose monitoring techniques  States the effect of stress and exercise on blood glucose levels  States the importance of limiting caffeine and abstaining from alcohol and smoking  Blood glucose monitor given: none  Patient instructed to monitor glucose levels: FBS: 60 - <95; 1 hour: <140; 2 hour: <120  Patient received handouts:  Nutrition Diabetes and Pregnancy, including carb counting list  Patient will be seen for follow-up as needed.

## 2020-05-09 DIAGNOSIS — Z3689 Encounter for other specified antenatal screening: Secondary | ICD-10-CM | POA: Diagnosis not present

## 2020-05-09 DIAGNOSIS — Z3A28 28 weeks gestation of pregnancy: Secondary | ICD-10-CM | POA: Diagnosis not present

## 2020-05-09 DIAGNOSIS — O99612 Diseases of the digestive system complicating pregnancy, second trimester: Secondary | ICD-10-CM | POA: Diagnosis not present

## 2020-05-16 DIAGNOSIS — Z3A29 29 weeks gestation of pregnancy: Secondary | ICD-10-CM | POA: Diagnosis not present

## 2020-05-16 DIAGNOSIS — O99612 Diseases of the digestive system complicating pregnancy, second trimester: Secondary | ICD-10-CM | POA: Diagnosis not present

## 2020-05-23 DIAGNOSIS — O99612 Diseases of the digestive system complicating pregnancy, second trimester: Secondary | ICD-10-CM | POA: Diagnosis not present

## 2020-05-23 DIAGNOSIS — Z3A30 30 weeks gestation of pregnancy: Secondary | ICD-10-CM | POA: Diagnosis not present

## 2020-05-30 DIAGNOSIS — O24414 Gestational diabetes mellitus in pregnancy, insulin controlled: Secondary | ICD-10-CM | POA: Diagnosis not present

## 2020-05-30 DIAGNOSIS — Z3A31 31 weeks gestation of pregnancy: Secondary | ICD-10-CM | POA: Diagnosis not present

## 2020-06-06 NOTE — Discharge Summary (Signed)
    Patient ID: Joanne Mcguire [id number] 07/21/1981 39 y.o.  Admit date: 03/11/2020 Discharge date: 03/12/2020  Admitting Diagnosis: Acute appendicitis  Discharge Diagnosis Patient Active Problem List   Diagnosis Date Noted  . Abnormal glucose tolerance test (GTT) during pregnancy, antepartum 05/02/2020  . Acute appendicitis 03/11/2020  . Depression 05/16/2015  . Postpartum care following cesarean delivery 06/11/2012    Consultants General Surgery [facility] Primary Team)  Reason for Admission: Patient admitted by OB. Consult note as follows  "Joanne Mcguire is a 39 y.o. [phone] at [redacted]w[redacted]d who presented to the hospital with right lower quadrant abdominal pain with associated chills and nausea.  Patient reports yesterday around 4 PM she began having a "upset stomach" and describes this as an achiness around her umbilicus.  She reports around 6-7 PM her pain began migrating to the right side of her abdomen, worse in the lower portion and became severe.  She reports her pain was a 7/10 with intermittent episodes of pain that increased to a 10/10.  She reports she began having chills, subjective fever, as well as nausea.  She denies any emesis.  She notes that movement makes her symptoms worse.  She tried Tums for this at home without relief.  She presented to MAU was found to have WBC 13.5 and questional appendicitis on MRI.  We were asked to see. She is not on blood thinners. Notes 3 prior c-sections. No other abdominal surgeries. She is currently NPO.   Procedures Dr. Dwain Sarna - Laparoscopic Appendectomy - 03/11/2020  Hospital Course:  The patient was admitted and underwent a laparoscopic appendectomy.  The patient tolerated the procedure well.  On POD 1, the patient was tolerating a diet, voiding well, mobilizing, and pain was controlled with oral pain medications.  The patient was stable for DC home at this time with appropriate follow up made.  Physical Exam: Please see MD's progress  note from day of d/c   Allergies as of 03/12/2020      Reactions   Oxycodone Itching   Particularly severe itching not responding to oral Benadryl.  Hydrocodone seems to be OK   Latex Rash      Medication List    TAKE these medications   calcium carbonate 500 MG chewable tablet Commonly known as: TUMS - dosed in mg elemental calcium Chew 1 tablet by mouth daily.         Follow-up Information    Surgery, [facility] Follow up in 3 week(s).   Specialty: General Surgery Contact information: [address] [place] [address] [phone]               Signed: Leary Roca, [facility] 06/06/2020, 4:02 PM  Please see Amion for pager number during day hours 7:00am-4:30pm

## 2020-06-11 DIAGNOSIS — O24414 Gestational diabetes mellitus in pregnancy, insulin controlled: Secondary | ICD-10-CM | POA: Diagnosis not present

## 2020-06-11 DIAGNOSIS — Z3A33 33 weeks gestation of pregnancy: Secondary | ICD-10-CM | POA: Diagnosis not present

## 2020-06-16 ENCOUNTER — Other Ambulatory Visit: Payer: Self-pay | Admitting: Obstetrics and Gynecology

## 2020-06-19 DIAGNOSIS — Z3A34 34 weeks gestation of pregnancy: Secondary | ICD-10-CM | POA: Diagnosis not present

## 2020-06-19 DIAGNOSIS — O24414 Gestational diabetes mellitus in pregnancy, insulin controlled: Secondary | ICD-10-CM | POA: Diagnosis not present

## 2020-06-25 DIAGNOSIS — Z3685 Encounter for antenatal screening for Streptococcus B: Secondary | ICD-10-CM | POA: Diagnosis not present

## 2020-06-25 DIAGNOSIS — Z3A35 35 weeks gestation of pregnancy: Secondary | ICD-10-CM | POA: Diagnosis not present

## 2020-06-25 DIAGNOSIS — O24414 Gestational diabetes mellitus in pregnancy, insulin controlled: Secondary | ICD-10-CM | POA: Diagnosis not present

## 2020-07-03 DIAGNOSIS — O24414 Gestational diabetes mellitus in pregnancy, insulin controlled: Secondary | ICD-10-CM | POA: Diagnosis not present

## 2020-07-03 DIAGNOSIS — Z3A36 36 weeks gestation of pregnancy: Secondary | ICD-10-CM | POA: Diagnosis not present

## 2020-07-04 ENCOUNTER — Encounter (HOSPITAL_COMMUNITY): Payer: Self-pay

## 2020-07-04 DIAGNOSIS — O34219 Maternal care for unspecified type scar from previous cesarean delivery: Secondary | ICD-10-CM | POA: Diagnosis not present

## 2020-07-04 DIAGNOSIS — Z3A36 36 weeks gestation of pregnancy: Secondary | ICD-10-CM | POA: Diagnosis not present

## 2020-07-04 NOTE — Patient Instructions (Signed)
Joanne Mcguire  07/04/2020   Your procedure is scheduled on:  07/10/2020  Arrive at 1:15 PM at Entrance C on [address] at [facility]  and [employer]. You are invited to use the FREE valet parking or use the Visitor's parking deck.  Pick up the phone at the desk and dial [phone].  Call this number if you have problems the morning of surgery: [phone]  Remember:   Do not eat food:(After Midnight) Desps de medianoche.  Do not drink clear liquids: (After Midnight) Desps de medianoche.  Take these medicines the morning of surgery with A SIP OF WATER:  Do not take metformin the night before or the day of surgery.   Do not wear jewelry, make-up or nail polish.  Do not wear lotions, powders, or perfumes. Do not wear deodorant.  Do not shave 48 hours prior to surgery.  Do not bring valuables to the hospital.  [facility] is not   responsible for any belongings or valuables brought to the hospital.  Contacts, dentures or bridgework may not be worn into surgery.  Leave suitcase in the car. After surgery it may be brought to your room.  For patients admitted to the hospital, checkout time is 11:00 AM the day of              discharge.      Please read over the following fact sheets that you were given:     Preparing for Surgery

## 2020-07-08 ENCOUNTER — Encounter (HOSPITAL_COMMUNITY)
Admission: RE | Disposition: A | Discharge status: Home / Self Care | Payer: Self-pay | Source: Home / Self Care | Attending: Obstetrics and Gynecology

## 2020-07-08 ENCOUNTER — Inpatient Hospital Stay (HOSPITAL_COMMUNITY)
Admission: RE | Admit: 2020-07-08 | Discharge: 2020-07-10 | DRG: 787 | Disposition: A | Discharge status: Home / Self Care | Payer: BC Managed Care – PPO | Attending: Obstetrics and Gynecology | Admitting: Obstetrics and Gynecology

## 2020-07-08 ENCOUNTER — Inpatient Hospital Stay (HOSPITAL_COMMUNITY): Payer: BC Managed Care – PPO | Admitting: Anesthesiology

## 2020-07-08 ENCOUNTER — Other Ambulatory Visit (HOSPITAL_COMMUNITY)
Admission: RE | Admit: 2020-07-08 | Discharge: 2020-07-08 | Disposition: A | Discharge status: Home / Self Care | Payer: BC Managed Care – PPO | Source: Ambulatory Visit | Attending: Obstetrics and Gynecology | Admitting: Obstetrics and Gynecology

## 2020-07-08 ENCOUNTER — Inpatient Hospital Stay (HOSPITAL_COMMUNITY)
Admission: AD | Admit: 2020-07-08 | Payer: BC Managed Care – PPO | Source: Home / Self Care | Admitting: Obstetrics and Gynecology

## 2020-07-08 ENCOUNTER — Encounter (HOSPITAL_COMMUNITY): Payer: Self-pay | Admitting: Obstetrics and Gynecology

## 2020-07-08 ENCOUNTER — Other Ambulatory Visit: Payer: Self-pay

## 2020-07-08 DIAGNOSIS — O403XX0 Polyhydramnios, third trimester, not applicable or unspecified: Secondary | ICD-10-CM | POA: Diagnosis present

## 2020-07-08 DIAGNOSIS — O9081 Anemia of the puerperium: Secondary | ICD-10-CM | POA: Diagnosis not present

## 2020-07-08 DIAGNOSIS — O34211 Maternal care for low transverse scar from previous cesarean delivery: Principal | ICD-10-CM | POA: Diagnosis present

## 2020-07-08 DIAGNOSIS — D62 Acute posthemorrhagic anemia: Secondary | ICD-10-CM | POA: Diagnosis not present

## 2020-07-08 DIAGNOSIS — O99344 Other mental disorders complicating childbirth: Secondary | ICD-10-CM | POA: Diagnosis not present

## 2020-07-08 DIAGNOSIS — Z20822 Contact with and (suspected) exposure to covid-19: Secondary | ICD-10-CM | POA: Diagnosis present

## 2020-07-08 DIAGNOSIS — O134 Gestational [pregnancy-induced] hypertension without significant proteinuria, complicating childbirth: Secondary | ICD-10-CM | POA: Diagnosis not present

## 2020-07-08 DIAGNOSIS — O24424 Gestational diabetes mellitus in childbirth, insulin controlled: Secondary | ICD-10-CM | POA: Diagnosis not present

## 2020-07-08 DIAGNOSIS — O24425 Gestational diabetes mellitus in childbirth, controlled by oral hypoglycemic drugs: Secondary | ICD-10-CM | POA: Diagnosis present

## 2020-07-08 DIAGNOSIS — F329 Major depressive disorder, single episode, unspecified: Secondary | ICD-10-CM | POA: Diagnosis present

## 2020-07-08 DIAGNOSIS — O34219 Maternal care for unspecified type scar from previous cesarean delivery: Secondary | ICD-10-CM | POA: Diagnosis present

## 2020-07-08 DIAGNOSIS — Z98891 History of uterine scar from previous surgery: Secondary | ICD-10-CM

## 2020-07-08 DIAGNOSIS — O139 Gestational [pregnancy-induced] hypertension without significant proteinuria, unspecified trimester: Secondary | ICD-10-CM | POA: Diagnosis present

## 2020-07-08 DIAGNOSIS — O4202 Full-term premature rupture of membranes, onset of labor within 24 hours of rupture: Secondary | ICD-10-CM | POA: Diagnosis not present

## 2020-07-08 DIAGNOSIS — Z3A37 37 weeks gestation of pregnancy: Secondary | ICD-10-CM | POA: Diagnosis not present

## 2020-07-08 DIAGNOSIS — Z23 Encounter for immunization: Secondary | ICD-10-CM | POA: Diagnosis not present

## 2020-07-08 DIAGNOSIS — O24419 Gestational diabetes mellitus in pregnancy, unspecified control: Secondary | ICD-10-CM

## 2020-07-08 HISTORY — DX: Cardiac arrhythmia, unspecified: I49.9

## 2020-07-08 LAB — GLUCOSE, CAPILLARY
Glucose-Capillary: 72 mg/dL (ref 70–99)
Glucose-Capillary: 82 mg/dL (ref 70–99)

## 2020-07-08 LAB — CBC
HCT: 33.3 % — ABNORMAL LOW (ref 36.0–46.0)
Hemoglobin: 11.3 g/dL — ABNORMAL LOW (ref 12.0–15.0)
MCH: 32.2 pg (ref 26.0–34.0)
MCHC: 33.9 g/dL (ref 30.0–36.0)
MCV: 94.9 fL (ref 80.0–100.0)
Platelets: 181 10*3/uL (ref 150–400)
RBC: 3.51 MIL/uL — ABNORMAL LOW (ref 3.87–5.11)
RDW: 14 % (ref 11.5–15.5)
WBC: 9.7 10*3/uL (ref 4.0–10.5)
nRBC: 0 % (ref 0.0–0.2)

## 2020-07-08 LAB — TYPE AND SCREEN
ABO/RH(D): O POS
Antibody Screen: NEGATIVE

## 2020-07-08 LAB — COMPREHENSIVE METABOLIC PANEL
ALT: 13 U/L (ref 0–44)
AST: 18 U/L (ref 15–41)
Albumin: 2.7 g/dL — ABNORMAL LOW (ref 3.5–5.0)
Alkaline Phosphatase: 102 U/L (ref 38–126)
Anion gap: 9 (ref 5–15)
BUN: 8 mg/dL (ref 6–20)
CO2: 24 mmol/L (ref 22–32)
Calcium: 8.8 mg/dL — ABNORMAL LOW (ref 8.9–10.3)
Chloride: 106 mmol/L (ref 98–111)
Creatinine, Ser: 0.68 mg/dL (ref 0.44–1.00)
GFR calc Af Amer: >60 mL/min (ref >60)
GFR calc non Af Amer: >60 mL/min (ref >60)
Glucose, Bld: 74 mg/dL (ref 70–99)
Potassium: 4.1 mmol/L (ref 3.5–5.1)
Sodium: 139 mmol/L (ref 135–145)
Total Bilirubin: 0.4 mg/dL (ref 0.3–1.2)
Total Protein: 5.6 g/dL — ABNORMAL LOW (ref 6.5–8.1)

## 2020-07-08 LAB — SARS CORONAVIRUS 2 (TAT 6-24 HRS): SARS Coronavirus 2: NEGATIVE

## 2020-07-08 LAB — RPR: RPR Ser Ql: NONREACTIVE

## 2020-07-08 SURGERY — Surgical Case
Anesthesia: Spinal | Site: Abdomen | Wound class: Clean Contaminated

## 2020-07-08 MED ORDER — DIBUCAINE (PERIANAL) 1 % EX OINT: 1.0000 "application " | TOPICAL_OINTMENT | CUTANEOUS | Status: DC | PRN

## 2020-07-08 MED ORDER — DIPHENHYDRAMINE HCL 50 MG/ML IJ SOLN: 12.5000 mg | INTRAMUSCULAR | Status: DC | PRN

## 2020-07-08 MED ORDER — SIMETHICONE 80 MG PO CHEW
80.0000 mg | CHEWABLE_TABLET | ORAL | Status: DC
  Administered 2020-07-09 (×2): 80 mg via ORAL
  Filled 2020-07-08 (×2): qty 1

## 2020-07-08 MED ORDER — METHYLERGONOVINE MALEATE 0.2 MG PO TABS: 0.2000 mg | ORAL_TABLET | ORAL | Status: DC | PRN

## 2020-07-08 MED ORDER — NALOXONE HCL 0.4 MG/ML IJ SOLN: 0.4000 mg | INTRAMUSCULAR | Status: DC | PRN

## 2020-07-08 MED ORDER — BUPIVACAINE HCL (PF) 0.25 % IJ SOLN
INTRAMUSCULAR | Status: AC
  Filled 2020-07-08: qty 20

## 2020-07-08 MED ORDER — TRANEXAMIC ACID-NACL 1000-0.7 MG/100ML-% IV SOLN
INTRAVENOUS | Status: AC
  Filled 2020-07-08: qty 100

## 2020-07-08 MED ORDER — TRANEXAMIC ACID-NACL 1000-0.7 MG/100ML-% IV SOLN
INTRAVENOUS | Status: DC | PRN
  Administered 2020-07-08: 1000 mg via INTRAVENOUS

## 2020-07-08 MED ORDER — NALBUPHINE HCL 10 MG/ML IJ SOLN: 5.0000 mg | INTRAMUSCULAR | Status: DC | PRN

## 2020-07-08 MED ORDER — NALBUPHINE HCL 10 MG/ML IJ SOLN
INTRAMUSCULAR | Status: AC
  Filled 2020-07-08: qty 1

## 2020-07-08 MED ORDER — ZOLPIDEM TARTRATE 5 MG PO TABS: 5.0000 mg | ORAL_TABLET | Freq: Every evening | ORAL | Status: DC | PRN

## 2020-07-08 MED ORDER — HYDROMORPHONE HCL 1 MG/ML IJ SOLN: 0.2500 mg | INTRAMUSCULAR | Status: DC | PRN

## 2020-07-08 MED ORDER — SIMETHICONE 80 MG PO CHEW
80.0000 mg | CHEWABLE_TABLET | Freq: Three times a day (TID) | ORAL | Status: DC
  Administered 2020-07-09 – 2020-07-10 (×5): 80 mg via ORAL
  Filled 2020-07-08 (×6): qty 1

## 2020-07-08 MED ORDER — SIMETHICONE 80 MG PO CHEW
80.0000 mg | CHEWABLE_TABLET | ORAL | Status: DC | PRN
  Administered 2020-07-08: 80 mg via ORAL

## 2020-07-08 MED ORDER — KETOROLAC TROMETHAMINE 30 MG/ML IJ SOLN
INTRAMUSCULAR | Status: AC
  Filled 2020-07-08: qty 1

## 2020-07-08 MED ORDER — NALOXONE HCL 4 MG/10ML IJ SOLN
1.0000 ug/kg/h | INTRAVENOUS | Status: DC | PRN
  Filled 2020-07-08: qty 5

## 2020-07-08 MED ORDER — SCOPOLAMINE 1 MG/3DAYS TD PT72
1.0000 | MEDICATED_PATCH | Freq: Once | TRANSDERMAL | Status: DC
  Administered 2020-07-08: 1.5 mg via TRANSDERMAL

## 2020-07-08 MED ORDER — ONDANSETRON HCL 4 MG/2ML IJ SOLN: 4.0000 mg | Freq: Three times a day (TID) | INTRAMUSCULAR | Status: DC | PRN

## 2020-07-08 MED ORDER — SENNOSIDES-DOCUSATE SODIUM 8.6-50 MG PO TABS
2.0000 | ORAL_TABLET | ORAL | Status: DC
  Administered 2020-07-09 (×2): 2 via ORAL
  Filled 2020-07-08 (×3): qty 2

## 2020-07-08 MED ORDER — PHENYLEPHRINE HCL-NACL 20-0.9 MG/250ML-% IV SOLN
INTRAVENOUS | Status: AC
  Filled 2020-07-08: qty 250

## 2020-07-08 MED ORDER — DIPHENHYDRAMINE HCL 25 MG PO CAPS
25.0000 mg | ORAL_CAPSULE | ORAL | Status: DC | PRN
  Administered 2020-07-08 – 2020-07-09 (×2): 25 mg via ORAL

## 2020-07-08 MED ORDER — COCONUT OIL OIL: 1.0000 "application " | TOPICAL_OIL | Status: DC | PRN

## 2020-07-08 MED ORDER — SODIUM CHLORIDE 0.9 % IV SOLN: INTRAVENOUS | Status: DC | PRN

## 2020-07-08 MED ORDER — ONDANSETRON HCL 4 MG/2ML IJ SOLN
INTRAMUSCULAR | Status: DC | PRN
  Administered 2020-07-08: 4 mg via INTRAVENOUS

## 2020-07-08 MED ORDER — BUPIVACAINE IN DEXTROSE 0.75-8.25 % IT SOLN
INTRATHECAL | Status: DC | PRN
  Administered 2020-07-08: 1.6 mL via INTRATHECAL

## 2020-07-08 MED ORDER — PHENYLEPHRINE HCL-NACL 20-0.9 MG/250ML-% IV SOLN
INTRAVENOUS | Status: DC | PRN
  Administered 2020-07-08: 60 ug/min via INTRAVENOUS

## 2020-07-08 MED ORDER — OXYTOCIN-SODIUM CHLORIDE 30-0.9 UT/500ML-% IV SOLN
INTRAVENOUS | Status: AC
  Filled 2020-07-08: qty 500

## 2020-07-08 MED ORDER — METHYLERGONOVINE MALEATE 0.2 MG/ML IJ SOLN: 0.2000 mg | INTRAMUSCULAR | Status: DC | PRN

## 2020-07-08 MED ORDER — MEPERIDINE HCL 25 MG/ML IJ SOLN: 6.2500 mg | INTRAMUSCULAR | Status: DC | PRN

## 2020-07-08 MED ORDER — LACTATED RINGERS IV SOLN: INTRAVENOUS | Status: DC

## 2020-07-08 MED ORDER — DIPHENHYDRAMINE HCL 25 MG PO CAPS
25.0000 mg | ORAL_CAPSULE | Freq: Four times a day (QID) | ORAL | Status: DC | PRN
  Administered 2020-07-09: 25 mg via ORAL
  Filled 2020-07-08 (×3): qty 1

## 2020-07-08 MED ORDER — PRENATAL MULTIVITAMIN CH
1.0000 | ORAL_TABLET | Freq: Every day | ORAL | Status: DC
  Administered 2020-07-09 – 2020-07-10 (×2): 1 via ORAL
  Filled 2020-07-08 (×2): qty 1

## 2020-07-08 MED ORDER — BUPIVACAINE HCL (PF) 0.25 % IJ SOLN
INTRAMUSCULAR | Status: DC | PRN
  Administered 2020-07-08: 20 mL

## 2020-07-08 MED ORDER — SODIUM CHLORIDE 0.9% FLUSH: 3.0000 mL | INTRAVENOUS | Status: DC | PRN

## 2020-07-08 MED ORDER — CEFAZOLIN SODIUM-DEXTROSE 2-4 GM/100ML-% IV SOLN
2.0000 g | INTRAVENOUS | Status: AC
  Administered 2020-07-08: 2 g via INTRAVENOUS

## 2020-07-08 MED ORDER — FENTANYL CITRATE (PF) 100 MCG/2ML IJ SOLN
INTRAMUSCULAR | Status: DC | PRN
Start: 2020-07-08 — End: 2020-07-08
  Administered 2020-07-08: 15 ug via INTRAVENOUS

## 2020-07-08 MED ORDER — STERILE WATER FOR IRRIGATION IR SOLN
Status: DC | PRN
  Administered 2020-07-08: 1000 mL

## 2020-07-08 MED ORDER — WITCH HAZEL-GLYCERIN EX PADS: 1.0000 "application " | MEDICATED_PAD | CUTANEOUS | Status: DC | PRN

## 2020-07-08 MED ORDER — KETOROLAC TROMETHAMINE 30 MG/ML IJ SOLN
30.0000 mg | Freq: Four times a day (QID) | INTRAMUSCULAR | Status: AC
  Administered 2020-07-08 – 2020-07-09 (×3): 30 mg via INTRAVENOUS
  Filled 2020-07-08 (×3): qty 1

## 2020-07-08 MED ORDER — POVIDONE-IODINE 10 % EX SWAB: 2.0000 "application " | Freq: Once | CUTANEOUS | Status: DC

## 2020-07-08 MED ORDER — HYDROCODONE-ACETAMINOPHEN 5-325 MG PO TABS
1.0000 | ORAL_TABLET | ORAL | Status: DC | PRN
  Administered 2020-07-08: 2 via ORAL
  Administered 2020-07-09: 1 via ORAL
  Administered 2020-07-09: 2 via ORAL
  Administered 2020-07-09: 1 via ORAL
  Administered 2020-07-09 – 2020-07-10 (×3): 2 via ORAL
  Filled 2020-07-08: qty 2
  Filled 2020-07-08: qty 1
  Filled 2020-07-08 (×3): qty 2
  Filled 2020-07-08: qty 1
  Filled 2020-07-08: qty 2

## 2020-07-08 MED ORDER — OXYTOCIN-SODIUM CHLORIDE 30-0.9 UT/500ML-% IV SOLN
INTRAVENOUS | Status: DC | PRN
  Administered 2020-07-08: 30 [IU] via INTRAVENOUS

## 2020-07-08 MED ORDER — IBUPROFEN 800 MG PO TABS
800.0000 mg | ORAL_TABLET | Freq: Four times a day (QID) | ORAL | Status: DC
  Administered 2020-07-09 – 2020-07-10 (×4): 800 mg via ORAL
  Filled 2020-07-08 (×4): qty 1

## 2020-07-08 MED ORDER — TETANUS-DIPHTH-ACELL PERTUSSIS 5-2.5-18.5 LF-MCG/0.5 IM SUSP: 0.5000 mL | Freq: Once | INTRAMUSCULAR | Status: DC

## 2020-07-08 MED ORDER — MORPHINE SULFATE (PF) 0.5 MG/ML IJ SOLN
INTRAMUSCULAR | Status: DC | PRN
  Administered 2020-07-08: 150 ug via INTRATHECAL

## 2020-07-08 MED ORDER — SODIUM CHLORIDE 0.9 % IR SOLN
Status: DC | PRN
  Administered 2020-07-08: 1000 mL

## 2020-07-08 MED ORDER — NALBUPHINE HCL 10 MG/ML IJ SOLN
5.0000 mg | Freq: Once | INTRAMUSCULAR | Status: AC | PRN
  Administered 2020-07-08: 5 mg via INTRAVENOUS

## 2020-07-08 MED ORDER — NALBUPHINE HCL 10 MG/ML IJ SOLN: 5.0000 mg | Freq: Once | INTRAMUSCULAR | Status: AC | PRN

## 2020-07-08 MED ORDER — OXYTOCIN-SODIUM CHLORIDE 30-0.9 UT/500ML-% IV SOLN: 2.5000 [IU]/h | INTRAVENOUS | Status: AC

## 2020-07-08 MED ORDER — PROMETHAZINE HCL 25 MG/ML IJ SOLN: 6.2500 mg | INTRAMUSCULAR | Status: DC | PRN

## 2020-07-08 MED ORDER — KETOROLAC TROMETHAMINE 30 MG/ML IJ SOLN
30.0000 mg | Freq: Once | INTRAMUSCULAR | Status: AC | PRN
  Administered 2020-07-08: 30 mg via INTRAVENOUS

## 2020-07-08 MED ORDER — MORPHINE SULFATE (PF) 0.5 MG/ML IJ SOLN
INTRAMUSCULAR | Status: AC
  Filled 2020-07-08: qty 10

## 2020-07-08 MED ORDER — FENTANYL CITRATE (PF) 100 MCG/2ML IJ SOLN
INTRAMUSCULAR | Status: AC
  Filled 2020-07-08: qty 2

## 2020-07-08 MED ORDER — ESCITALOPRAM OXALATE 10 MG PO TABS
10.0000 mg | ORAL_TABLET | Freq: Every evening | ORAL | Status: DC
  Administered 2020-07-09: 10 mg via ORAL
  Filled 2020-07-08 (×2): qty 1

## 2020-07-08 MED ORDER — CEFAZOLIN SODIUM-DEXTROSE 2-4 GM/100ML-% IV SOLN
INTRAVENOUS | Status: AC
  Filled 2020-07-08: qty 100

## 2020-07-08 MED ORDER — SCOPOLAMINE 1 MG/3DAYS TD PT72
MEDICATED_PATCH | TRANSDERMAL | Status: AC
  Filled 2020-07-08: qty 1

## 2020-07-08 MED ORDER — ONDANSETRON HCL 4 MG/2ML IJ SOLN
INTRAMUSCULAR | Status: AC
  Filled 2020-07-08: qty 2

## 2020-07-08 MED ORDER — MENTHOL 3 MG MT LOZG: 1.0000 | LOZENGE | OROMUCOSAL | Status: DC | PRN

## 2020-07-08 SURGICAL SUPPLY — 36 items
BENZOIN TINCTURE PRP APPL 2/3 (GAUZE/BANDAGES/DRESSINGS) ×2 IMPLANT
CHLORAPREP W/TINT 26ML (MISCELLANEOUS) ×2 IMPLANT
CLAMP CORD UMBIL (MISCELLANEOUS) IMPLANT
CLOSURE STERI STRIP 1/2 X4 (GAUZE/BANDAGES/DRESSINGS) ×2 IMPLANT
CLOTH BEACON ORANGE TIMEOUT ST (SAFETY) ×2 IMPLANT
DRSG OPSITE POSTOP 4X10 (GAUZE/BANDAGES/DRESSINGS) ×2 IMPLANT
ELECT REM PT RETURN 9FT ADLT (ELECTROSURGICAL) ×2
ELECTRODE REM PT RTRN 9FT ADLT (ELECTROSURGICAL) ×1 IMPLANT
EXTRACTOR VACUUM M CUP 4 TUBE (SUCTIONS) IMPLANT
GLOVE BIO SURGEON STRL SZ7.5 (GLOVE) ×2 IMPLANT
GLOVE BIOGEL PI IND STRL 7.0 (GLOVE) ×1 IMPLANT
GLOVE BIOGEL PI INDICATOR 7.0 (GLOVE) ×1
GOWN STRL REUS W/TWL LRG LVL3 (GOWN DISPOSABLE) ×4 IMPLANT
KIT ABG SYR 3ML LUER SLIP (SYRINGE) IMPLANT
NEEDLE HYPO 22GX1.5 SAFETY (NEEDLE) ×2 IMPLANT
NEEDLE HYPO 25X5/8 SAFETYGLIDE (NEEDLE) IMPLANT
NEEDLE SPNL 20GX3.5 QUINCKE YW (NEEDLE) IMPLANT
NS IRRIG 1000ML POUR BTL (IV SOLUTION) ×2 IMPLANT
PACK C SECTION WH (CUSTOM PROCEDURE TRAY) ×2 IMPLANT
PENCIL SMOKE EVAC W/HOLSTER (ELECTROSURGICAL) ×2 IMPLANT
STRIP CLOSURE SKIN 1/2X4 (GAUZE/BANDAGES/DRESSINGS) ×2 IMPLANT
SUT MNCRL 0 VIOLET CTX 36 (SUTURE) ×2 IMPLANT
SUT MNCRL AB 3-0 PS2 27 (SUTURE) IMPLANT
SUT MON AB 2-0 CT1 27 (SUTURE) ×2 IMPLANT
SUT MON AB-0 CT1 36 (SUTURE) ×4 IMPLANT
SUT MONOCRYL 0 CTX 36 (SUTURE) ×4
SUT PLAIN 0 NONE (SUTURE) IMPLANT
SUT PLAIN 2 0 (SUTURE)
SUT PLAIN 2 0 XLH (SUTURE) IMPLANT
SUT PLAIN ABS 2-0 CT1 27XMFL (SUTURE) IMPLANT
SUT VIC AB 4-0 KS 27 (SUTURE) ×2 IMPLANT
SYR 20CC LL (SYRINGE) IMPLANT
SYR CONTROL 10ML LL (SYRINGE) ×2 IMPLANT
TOWEL OR 17X24 6PK STRL BLUE (TOWEL DISPOSABLE) ×2 IMPLANT
TRAY FOLEY W/BAG SLVR 14FR LF (SET/KITS/TRAYS/PACK) ×2 IMPLANT
WATER STERILE IRR 1000ML POUR (IV SOLUTION) ×2 IMPLANT

## 2020-07-08 NOTE — H&P (Addendum)
Joanne Mcguire is a 39 y.o. female presenting for SROM at term with csection x 3 for rpt. . OB History    Gravida  4   Para  3   Term  2   Preterm  1   AB      Living  3     SAB      TAB      Ectopic      Multiple      Live Births  1          Past Medical History:  Diagnosis Date  . Anxiety   . Chronic headaches   . Dysrhythmia    PVC and PAC  . Heartburn in pregnancy   . History of cesarean section 06/10/2012  . Postpartum depression    Past Surgical History:  Procedure Laterality Date  . CESAREAN SECTION     x 3  . CESAREAN SECTION  06/09/2012   Procedure: CESAREAN SECTION;  Surgeon: Lenoard Aden, MD;  Location: [facility];  Service: Gynecology;  Laterality: N/A;  EDD: 06/15/12, repeat  . KNEE ARTHROSCOPY Left 2011  . LAPAROSCOPIC APPENDECTOMY N/A 03/11/2020   Procedure: APPENDECTOMY LAPAROSCOPIC;  Surgeon: Emelia Loron, MD;  Location: [facility] OR;  Service: General;  Laterality: N/A;  . TONSILLECTOMY     adenoids and deviated septum   Family History: family history includes Bipolar disorder in her mother; Cirrhosis in her father and paternal grandmother; Colon cancer in her maternal grandmother; Colon polyps in her maternal grandmother; Diabetes in her maternal grandmother; Hypertension in her father; Hypothyroidism in her mother; Lung cancer in her brother. Social History:  reports that she has never smoked. She has never used smokeless tobacco. She reports that she does not drink alcohol and does not use drugs.     Maternal Diabetes: Yes:  Diabetes Type:  Insulin/Medication controlled Genetic Screening: Normal Maternal Ultrasounds/Referrals: Normal Fetal Ultrasounds or other Referrals:  None Maternal Substance Abuse:  No Significant Maternal Medications:  Meds include: Other: metformin Significant Maternal Lab Results:  Group B Strep negative Other Comments:  None  Review of Systems  Constitutional: Negative.   All other systems reviewed and are  negative.  Maternal Medical History:  Reason for admission: Rupture of membranes.   Contractions: Onset was 3-5 hours ago.   Frequency: irregular.   Perceived severity is moderate.    Fetal activity: Perceived fetal activity is normal.   Last perceived fetal movement was within the past hour.    Prenatal complications: Polyhydramnios.   Prenatal Complications - Diabetes: gestational. Diabetes is managed by oral agent (monotherapy).        There were no vitals taken for this visit. Maternal Exam:  Uterine Assessment: Contraction strength is mild.  Contraction frequency is irregular.   Abdomen: Patient reports no abdominal tenderness. Surgical scars: low transverse.   Fetal presentation: vertex  Introitus: Normal vulva. Normal vagina.  Ferning test: positive.  Nitrazine test: positive. Amniotic fluid character: not assessed.  Pelvis: questionable for delivery.   Cervix: Cervix evaluated by digital exam.     Physical Exam Vitals and nursing note reviewed.  Constitutional:      Appearance: Normal appearance.  HENT:     Head: Normocephalic and atraumatic.  Cardiovascular:     Rate and Rhythm: Normal rate and regular rhythm.     Pulses: Normal pulses.     Heart sounds: Normal heart sounds.  Pulmonary:     Effort: Pulmonary effort is normal.  Breath sounds: Normal breath sounds.  Abdominal:     Palpations: Abdomen is soft.  Genitourinary:    General: Normal vulva.  Musculoskeletal:        General: Normal range of motion.     Cervical back: Normal range of motion and neck supple.  Skin:    General: Skin is warm.  Neurological:     General: No focal deficit present.     Mental Status: She is alert and oriented to person, place, and time.     Prenatal labs: ABO, Rh: --/--/O POS (08/31 0825) Antibody: NEG (08/31 0825) Rubella:  imm RPR: NON REACTIVE (08/31 0825)  HBsAg: neg HIV:   neg GBS:   neg  Assessment/Plan: 37wk iup Gest HTN Csection x 3 for  rpt SROM A2DM History of LUS partial scar dehiscence Rpt csection. Surgical risks discussed. Consent done.    Tydarius Yawn J 07/08/2020, 3:56 PM

## 2020-07-08 NOTE — MAU Note (Signed)
Asymptomatic, swab collected. Lab here.

## 2020-07-08 NOTE — Anesthesia Procedure Notes (Signed)
Spinal  Patient location during procedure: OB Start time: 07/08/2020 5:22 PM End time: 07/08/2020 5:27 PM Staffing Performed: anesthesiologist  Anesthesiologist: Lowella Curb, MD Preanesthetic Checklist Completed: patient identified, IV checked, risks and benefits discussed, surgical consent, monitors and equipment checked, pre-op evaluation and timeout performed Spinal Block Patient position: sitting Prep: DuraPrep and site prepped and draped Patient monitoring: heart rate, cardiac monitor, continuous pulse ox and blood pressure Approach: midline Location: L3-4 Injection technique: single-shot Needle Needle type: Pencan  Needle gauge: 24 G Needle length: 10 cm Assessment Sensory level: T4

## 2020-07-08 NOTE — Op Note (Signed)
Cesarean Section Procedure Note  Indications: previous uterine incision kerr x3 or greater  Pre-operative Diagnosis: 37 week 3 day pregnancy. SROM Gestational HTN History of partial scar dehiscence  Post-operative Diagnosis: same  Surgeon: Lenoard Aden   Assistants: Renae Fickle, cnm  Anesthesia: Local anesthesia 0.25.% bupivacaine and Spinal anesthesia  ASA Class: 2  Procedure Details  The patient was seen in the Holding Room. The risks, benefits, complications, treatment options, and expected outcomes were discussed with the patient.  The patient concurred with the proposed plan, giving informed consent. The risks of anesthesia, infection, bleeding and possible injury to other organs discussed. Injury to bowel, bladder, or ureter with possible need for repair discussed. Possible need for transfusion with secondary risks of hepatitis or HIV acquisition discussed. Post operative complications to include but not limited to DVT, PE and Pneumonia noted. The site of surgery properly noted/marked. The patient was taken to Operating Room # B, identified as Jasslyn Finkel and the procedure verified as C-Section Delivery. A Time Out was held and the above information confirmed.  After induction of anesthesia, the patient was draped and prepped in the usual sterile manner. A Pfannenstiel incision was made and carried down through the subcutaneous tissue to the fascia. Fascial incision was made and extended transversely using Mayo scissors. The fascia was separated from the underlying rectus tissue superiorly and inferiorly. The peritoneum was identified and entered. Peritoneal incision was extended longitudinally. The utero-vesical peritoneal reflection was incised transversely and the bladder flap was bluntly sharplyfreed from the lower uterine segment. A low transverse uterine incision(Kerr hysterotomy) (thin LUS)was made.Meconium stained fluid. Delivered from OA presentation was a  female with Apgar  scores of 8 at one minute and 9 at five minutes. Bulb suctioning gently performed. Neonatal team in attendance.After the umbilical cord was clamped and cut cord blood was obtained for evaluation. The placenta was removed intact and appeared normal. The uterus was curetted with a dry lap pack. Good hemostasis was noted.The uterine outline, tubes and ovaries appeared normal. The uterine incision was closed with running locked sutures of 0 Monocryl x 1 layers. Hemostasis was observed. .The parietal peritoneum was closed with a running 2-0 Monocryl suture. The fascia was then reapproximated with running sutures of 0 Monocryl. The skin was reapproximated with 4-0 vicryl after [place] closure with 2-0 plain.  Instrument, sponge, and needle counts were correct prior the abdominal closure and at the conclusion of the case.   Findings: As noted  Estimated Blood Loss:  300 mL         Drains: foley                 Specimens: placenta                 Complications:  None; patient tolerated the procedure well.         Disposition: ICU - intubated and hemodynamically stable.         Condition: stable  Attending Attestation: I performed the procedure.

## 2020-07-08 NOTE — Transfer of Care (Signed)
Immediate Anesthesia Transfer of Care Note  Patient: Joanne Mcguire  Procedure(s) Performed: Repeat CESAREAN SECTION (N/A Abdomen)  Patient Location: PACU  Anesthesia Type:Spinal  Level of Consciousness: awake, alert  and oriented  Airway & Oxygen Therapy: Patient Spontanous Breathing  Post-op Assessment: Report given to RN and Post -op Vital signs reviewed and stable  Post vital signs: Reviewed and stable  Last Vitals:  Vitals Value Taken Time  BP 143/77 07/08/20 1830  Temp    Pulse 71 07/08/20 1834  Resp 14 07/08/20 1834  SpO2 98 % 07/08/20 1834  Vitals shown include unvalidated device data.  Last Pain:  Vitals:   07/08/20 1830  TempSrc: (P) Oral         Complications: No complications documented.

## 2020-07-08 NOTE — Progress Notes (Signed)
Patient seen and examined. Consent witnessed and signed. No changes noted. Update completed. Blood pressure (!) 153/83, pulse 70, temperature 98.4 F (36.9 C), temperature source Oral, resp. rate 18, height 5\' 4"  (1.626 m), weight 104.3 kg, SpO2 99 %.  CBC    Component Value Date/Time   WBC 9.7 07/08/2020 0825   RBC 3.51 (L) 07/08/2020 0825   HGB 11.3 (L) 07/08/2020 0825   HCT 33.3 (L) 07/08/2020 0825   PLT 181 07/08/2020 0825   MCV 94.9 07/08/2020 0825   MCH 32.2 07/08/2020 0825   MCHC 33.9 07/08/2020 0825   RDW 14.0 07/08/2020 0825   LYMPHSABS 2.0 03/11/2020 0642   MONOABS 0.7 03/11/2020 0642   EOSABS 0.0 03/11/2020 0642   BASOSABS 0.0 03/11/2020 0642   CMP     Component Value Date/Time   NA 139 07/08/2020 0825   K 4.1 07/08/2020 0825   CL 106 07/08/2020 0825   CO2 24 07/08/2020 0825   GLUCOSE 74 07/08/2020 0825   BUN 8 07/08/2020 0825   CREATININE 0.68 07/08/2020 0825   CALCIUM 8.8 (L) 07/08/2020 0825   PROT 5.6 (L) 07/08/2020 0825   ALBUMIN 2.7 (L) 07/08/2020 0825   AST 18 07/08/2020 0825   ALT 13 07/08/2020 0825   ALKPHOS 102 07/08/2020 0825   BILITOT 0.4 07/08/2020 0825   GFRNONAA >60 07/08/2020 0825   GFRAA >60 07/08/2020 0825

## 2020-07-08 NOTE — Anesthesia Preprocedure Evaluation (Signed)
Anesthesia Evaluation  Patient identified by MRN, date of birth, ID band Patient awake    Reviewed: Allergy & Precautions, H&P , NPO status , Patient's Chart, lab work & pertinent test results  History of Anesthesia Complications (+) history of anesthetic complications  Airway Mallampati: II       Dental no notable dental hx.    Pulmonary neg pulmonary ROS,    Pulmonary exam normal breath sounds clear to auscultation       Cardiovascular Exercise Tolerance: Good negative cardio ROS   Rhythm:regular Rate:Normal     Neuro/Psych  Headaches, PSYCHIATRIC DISORDERS Anxiety Depression negative psych ROS   GI/Hepatic negative GI ROS, Neg liver ROS,   Endo/Other  negative endocrine ROS  Renal/GU negative Renal ROS  negative genitourinary   Musculoskeletal   Abdominal Normal abdominal exam  (+) + obese,   Peds  Hematology negative hematology ROS (+)   Anesthesia Other Findings Complication of anesthesia   severe itching p.o CSx2  & knee surgery Heartburn in pregnancy        Depression   h/o pp depression    Reproductive/Obstetrics (+) Pregnancy                             Anesthesia Physical  Anesthesia Plan  ASA: II  Anesthesia Plan: Spinal   Post-op Pain Management:    Induction:   PONV Risk Score and Plan: 2 and Ondansetron, Midazolam and Treatment may vary due to age or medical condition  Airway Management Planned: Natural Airway  Additional Equipment:   Intra-op Plan:   Post-operative Plan:   Informed Consent: I have reviewed the patients History and Physical, chart, labs and discussed the procedure including the risks, benefits and alternatives for the proposed anesthesia with the patient or authorized representative who has indicated his/her understanding and acceptance.       Plan Discussed with: Anesthesiologist, CRNA and Surgeon  Anesthesia Plan Comments:          Anesthesia Quick Evaluation

## 2020-07-08 NOTE — Anesthesia Postprocedure Evaluation (Signed)
Anesthesia Post Note  Patient: Joanne Mcguire  Procedure(s) Performed: Repeat CESAREAN SECTION (N/A Abdomen)     Patient location during evaluation: PACU Anesthesia Type: Spinal Level of consciousness: awake and alert Pain management: pain level controlled Vital Signs Assessment: post-procedure vital signs reviewed and stable Respiratory status: spontaneous breathing and respiratory function stable Cardiovascular status: blood pressure returned to baseline and stable Postop Assessment: spinal receding Anesthetic complications: no   No complications documented.  Last Vitals:  Vitals:   07/08/20 1845 07/08/20 1900  BP: (!) 151/88 139/85  Pulse: 76 67  Resp: 18 13  Temp:  36.6 C  SpO2: 99% 97%    Last Pain:  Vitals:   07/08/20 1900  TempSrc: Axillary    LLE Motor Response: Purposeful movement (07/08/20 1900)   RLE Motor Response: Purposeful movement (07/08/20 1900)        Mellody Dance

## 2020-07-09 ENCOUNTER — Encounter (HOSPITAL_COMMUNITY): Payer: Self-pay | Admitting: Obstetrics and Gynecology

## 2020-07-09 DIAGNOSIS — O139 Gestational [pregnancy-induced] hypertension without significant proteinuria, unspecified trimester: Secondary | ICD-10-CM | POA: Diagnosis present

## 2020-07-09 DIAGNOSIS — O24419 Gestational diabetes mellitus in pregnancy, unspecified control: Secondary | ICD-10-CM

## 2020-07-09 LAB — CBC
HCT: 31.2 % — ABNORMAL LOW (ref 36.0–46.0)
Hemoglobin: 10.3 g/dL — ABNORMAL LOW (ref 12.0–15.0)
MCH: 32 pg (ref 26.0–34.0)
MCHC: 33 g/dL (ref 30.0–36.0)
MCV: 96.9 fL (ref 80.0–100.0)
Platelets: 156 10*3/uL (ref 150–400)
RBC: 3.22 MIL/uL — ABNORMAL LOW (ref 3.87–5.11)
RDW: 13.7 % (ref 11.5–15.5)
WBC: 13.8 10*3/uL — ABNORMAL HIGH (ref 4.0–10.5)
nRBC: 0 % (ref 0.0–0.2)

## 2020-07-09 NOTE — Clinical Social Work Maternal (Signed)
CLINICAL SOCIAL WORK MATERNAL/CHILD NOTE  Patient Details  Name: Joanne Mcguire MRN: [id number] Date of Birth: 04/28/1981  Date:  07/09/2020  Clinical Social Worker Initiating Note:  Roshad Hack, LCSW Date/Time: Initiated:  07/09/20/0920     Child's Name:  Joanne Mcguire   Biological Parents:  Mother, Father (Artia Regner, Patrick Hoefling)   Need for Interpreter:  None   Reason for Referral:  Behavioral Health Concerns   Address:  [address] [place] [address]    Phone number:  [phone] (home) [phone] (work)    Additional phone number: none   Household Members/Support Persons (HM/SP):   Household Member/Support Person 2, Household Member/Support Person 1   HM/SP Name Relationship DOB or Age  HM/SP -1  Lenny Brager MOB 11/16/1980  HM/SP -2 Patrick Greulich  FOB     HM/SP -3 Triton Dame  son 17 years old  HM/SP -4 Claxton Whatley  Son  12 years old   HM/SP -5 Sladen Hugh  Son  8 years old   HM/SP -6        HM/SP -7        HM/SP -8          Natural Supports (not living in the home):      Professional Supports: None   Employment: Full-time   Type of Work: on leave from [employer]   Education:  Some College   Homebound arranged:  n/a  Financial Resources:  Private Insurance (BCBS)   Other Resources:    none   Cultural/Religious Considerations Which May Impact Care:  none reported.   Strengths:  Ability to meet basic needs , Compliance with medical plan , Psychotropic Medications, Pediatrician chosen, Home prepared for child    Psychotropic Medications:  Lexapro      Pediatrician:    [place] area  Pediatrician List:    [facility]  [place]    [place]    [place]    [place]    [place]      Pediatrician Fax Number:    Risk Factors/Current Problems:  None   Cognitive State:  Insightful , Able to Concentrate , Alert    Mood/Affect:  Happy , Interested ,  Relaxed , Comfortable , Calm    CSW Assessment: CSW consulted as MOB has a hx of PPD, depression and Bipolar. CSW went to speak with MOB at bedside to address further needs.   CSW congratulated MOB and FOB on the birth of infant. CSW advised MOB of the HIPPA policy in which MOB reported that it was okay for spouse to remain in the room while CSW spoke with her. MOB reported that she was diagnosed with Bipolar "years ago". MOB reported that she has been on different medications over the years but reports that she is only taking Lexarpo at this time. MOB reported that she doesn't take any other medication, but feels that her lexarpo is working well for her at this time. MOB expressed that she was diagnosed with PPD after the birth of her oldest son who is 17 years old. MOB reported "I mean I have had PPD with all of my children but with him it was the worst". MOB expressed that when dealing with PPD with her oldest she felt that she didn't want to care for child and other feelings. MOB reported that since that birth, she has never felt the desire to not care for other children. MOB reported "I didn't know   that it was PPD at that time". CSW validated this and encouraged MOB to remain aware of feelings in PP Period  to reduce/catch feelings of PPD. MOB reported that she understood. MOB reported that she has no other mental health hx and denies SI and HI to this CSW.  CSW inquired from MOB on who her supports are at this time. MOB reported that she has support from her spouse as well as "my live in grandmother". MOB expressed that she has all needed items to care for infant and denies receiving WIC or Food Stamps with no desire to apply for either at this time. MOB reported that infant would be seen at [facility] for further care.   CSW took time to provide MOB with PPD and SIDS education. MOB was given PPD Checklist in order to keep track of feelings as they relate to PPD. MOB reported to this CSW that  infant would sleep in own room/nursery. MOB reported feeling pretty well since giving birth and reported no other needs to CSW.   CSW Plan/Description:  No Further Intervention Required/No Barriers to Discharge, Perinatal Mood and Anxiety Disorder (PMADs) Education, Sudden Infant Death Syndrome (SIDS) Education    Noreen Mackintosh S Adajah Cocking, LCSWA 07/09/2020, 9:35 AM 

## 2020-07-09 NOTE — Progress Notes (Signed)
Subjective: POD# 1 Live born female  Birth Weight: 7 lb 7.4 oz (3385 g) APGAR: 7, 9  Newborn Delivery   Birth date/time: 07/08/2020 17:51:00 Delivery type: C-Section, Low Transverse Trial of labor: No C-section categorization: Repeat      Delivering provider: Olivia Mackie   Feeding: bottle  Pain control at delivery: Spinal   Reports feeling well.  Patient reports tolerating PO.   Breast symptoms:none Pain controlled with PO meds Denies HA/SOB/C/P/N/V/dizziness. Flatus absent. She reports vaginal bleeding as normal, without clots.  She is ambulating, urinating without difficulty.     Objective:   VS:    Vitals:   07/09/20 0020 07/09/20 0220 07/09/20 0429 07/09/20 0630  BP: 127/73  129/76   Pulse: 73  74   Resp: 15 16 16    Temp: 97.8 F (36.6 C)  98.2 F (36.8 C)   TempSrc: Oral  Oral   SpO2: 97% 98% 98% 97%  Weight:      Height:          Intake/Output Summary (Last 24 hours) at 07/09/2020 0734 Last data filed at 07/09/2020 0145 Gross per 24 hour  Intake 600 ml  Output 918 ml  Net -318 ml        Recent Labs    07/08/20 0825 07/09/20 0520  WBC 9.7 13.8*  HGB 11.3* 10.3*  HCT 33.3* 31.2*  PLT 181 156   CBG (last 3)  Recent Labs    07/08/20 1602 07/08/20 1947  GLUCAP 72 82     Blood type: --/--/O POS (08/31 0825)  Rubella:   immune Vaccines: TDaP declined         Flu    declined   Physical Exam:  General: alert, cooperative and no distress CV: Regular rate and rhythm Resp: clear Abdomen: soft, nontender, normal bowel sounds Incision: intact and serous and small drainage present Uterine Fundus: firm, below umbilicus, nontender Lochia: minimal Ext: no edema, redness or tenderness in the calves or thighs   Assessment/Plan: 39 y.o.   POD# 1. 24                  Principal Problem:   Postpartum care following cesarean delivery 8/31 Active Problems:   Previous cesarean delivery affecting pregnancy   Gestational hypertension  -  normotensive PP, no neural sx.   - continue to monitor closely   GDM, class A2  - CBG stable, plan F/U PP 2GTT Depression - stable on Lexapro  Doing well, stable.               Advance diet as tolerated Encourage rest when baby rests Breastfeeding support Encourage to ambulate, warm fluid for gut motility Routine post-op care  9/31, CNM, MSN 07/09/2020, 7:34 AM

## 2020-07-10 MED ORDER — HYDROCODONE-ACETAMINOPHEN 5-325 MG PO TABS: 1.0000 | ORAL_TABLET | ORAL | 0 refills | Status: AC | PRN

## 2020-07-10 MED ORDER — HYDROCHLOROTHIAZIDE 12.5 MG PO CAPS: 12.5000 mg | ORAL_CAPSULE | Freq: Every day | ORAL | 0 refills | Status: AC

## 2020-07-10 MED ORDER — FERROUS SULFATE 325 (65 FE) MG PO TABS: 325.0000 mg | ORAL_TABLET | Freq: Every day | ORAL | 3 refills | Status: AC

## 2020-07-10 MED ORDER — IBUPROFEN 800 MG PO TABS: 800.0000 mg | ORAL_TABLET | Freq: Four times a day (QID) | ORAL | 0 refills | Status: AC

## 2020-07-10 MED ORDER — HYDROCHLOROTHIAZIDE 12.5 MG PO CAPS
12.5000 mg | ORAL_CAPSULE | Freq: Every day | ORAL | Status: DC
  Administered 2020-07-10: 12.5 mg via ORAL
  Filled 2020-07-10: qty 1

## 2020-07-10 NOTE — Progress Notes (Addendum)
POSTOPERATIVE DAY # 2 S/P Repeat C/S, baby girl "Moxolan"   S:         Reports feeling well, minimal soreness. Desires early d/c home today.   Denies HA, visual changes, RUQ/epigastric pain              Tolerating po intake / no nausea / no vomiting / + flatus / no BM  Denies dizziness, SOB, or CP             Bleeding is light             Pain controlled with Motrin and Norco             Up ad lib / ambulatory/ voiding QS without difficulty   Newborn formula feeding    O:  VS: BP 140/81 (BP Location: Right Arm)   Pulse 71   Temp 98.6 F (37 C) (Oral)   Resp 16   Ht 5\' 4"  (1.626 m)   Wt 104.3 kg   SpO2 100%   Breastfeeding Unknown   BMI 39.48 kg/m  Patient Vitals for the past 24 hrs:  BP Temp Temp src Pulse Resp SpO2  07/10/20 0551 140/81 98.6 F (37 C) Oral 71 16 100 %  07/09/20 2109 128/82 98.3 F (36.8 C) Oral 82 16 98 %  07/09/20 1227 130/78 98.4 F (36.9 C) Oral 87 16 100 %    LABS:               Recent Labs    07/08/20 0825 07/09/20 0520  WBC 9.7 13.8*  HGB 11.3* 10.3*  PLT 181 156               Bloodtype: --/--/O POS (08/31 0825)  Rubella:                                               I&O: Intake/Output      09/01 0701 - 09/02 0700 09/02 0701 - 09/03 0700   I.V. (mL/kg)     Total Intake(mL/kg)     Urine (mL/kg/hr) 550 (0.2)    Blood     Total Output 550    Net -550         Urine Occurrence 1 x                 Physical Exam:             Alert and Oriented X3  Lungs: Clear and unlabored  Heart: regular rate and rhythm / no murmurs  Abdomen: soft, non-tender, moderate gaseous distention, active bowel sounds, obese              Fundus: firm, non-tender, U-1             Dressing: honeycomb dressing coming off and 50% saturated with old drainage             Incision:  approximated with sutures / no erythema / no ecchymosis / no drainage  Perineum: intact  Lochia: small rubra on pad  Extremities: +1 pitting edema, no calf pain or tenderness; +1 DTRs  bilaterally, no clonus bilaterally   A/P:      POD # 2 S/P Repeat C/S            Gestational hypertension   - BPs stable, 1 mild range   - Begin HCTZ  12.5mg  daily x 5 days    - Plan for close f/u in 1 week for BP check  ABL Anemia    - Begin oral FE daily x 6 weeks  Hx. Of depression   - stable on Lexapro  A2GDM   - f/u PP with 2 hr GTT  Routine postoperative care   Breast care for formula feeling mothers reviewed             Anticipate d/c home today if okay with peds  WOB discharge book given, instructions and warning s/s reviewed  F/u in 1 week for BP check   Carlean Jews, MSN, CNM [facility]

## 2020-07-10 NOTE — Discharge Summary (Signed)
Postpartum Discharge Summary  Date of Service updated 07/10/2020     Patient Name: Joanne Mcguire DOB: [birth year] MRN: [id number]  Date of admission: 07/08/2020 Delivery date:07/08/2020  Delivering provider: Brien Few  Date of discharge: 07/10/2020  Admitting diagnosis: Previous cesarean delivery affecting pregnancy [O34.219] Previous cesarean section [Z98.891] Intrauterine pregnancy: [redacted]w[redacted]d    Secondary diagnosis:  Principal Problem:   Postpartum care following cesarean delivery 8/31 Active Problems:   Previous cesarean delivery affecting pregnancy   Gestational hypertension   GDM, class A2  Additional problems: ABL Anemia, dependent edema     Discharge diagnosis: Term Pregnancy Delivered, Gestational Hypertension, GDM A2 and Anemia                                              Post partum procedures:n/a Augmentation: N/A Complications: None  Hospital course: Sceduled C/S   39y.o. yo [id number] admitted to the hospital 07/08/2020 for scheduled cesarean section with the following indication:Elective Repeat and SROM.Delivery details are as follows:  Membrane Rupture Time/Date: 1:45 PM ,07/08/2020   Delivery Method:C-Section, Low Transverse  Details of operation can be found in separate operative note.  Patient had an uncomplicated postpartum course.  She is ambulating, tolerating a regular diet, passing flatus, and urinating well. Patient is discharged home in stable condition on  07/10/20        Newborn Data: Birth date:07/08/2020  Birth time:5:51 PM  Gender:Female  Living status:Living  Apgars:7 ,9  Weight:3385 g     Magnesium Sulfate received: No BMZ received: No Rhophylac:N/A MMR:N/A T-DaP: declined Flu: declined Transfusion:No  Physical exam  Vitals:   07/09/20 0830 07/09/20 1227 07/09/20 2109 07/10/20 0551  BP: 136/85 130/78 128/82 140/81  Pulse: 63 87 82 71  Resp: _0 Temp: 98.5 F (36.9 C) 98.4 F (36.9 C) 98.3 F (36.8 C)  98.6 F (37 C)  TempSrc:  Oral Oral Oral  SpO2: 99% 100% 98% 100%  Weight:      Height:      Alert and Oriented X3             Lungs: Clear and unlabored             Heart: regular rate and rhythm / no murmurs             Abdomen: soft, non-tender, moderate gaseous distention, active bowel sounds, obese              Fundus: firm, non-tender, U-1             Dressing: honeycomb dressing coming off and 50% saturated with old drainage             Incision:  approximated with sutures / no erythema / no ecchymosis / no drainage             Perineum: intact             Lochia: small rubra on pad             Extremities: +1 pitting edema, no calf pain or tenderness; +1 DTRs bilaterally, no clonus bilaterally   Labs: Lab Results  Component Value Date   WBC 13.8 (H) 07/09/2020   HGB 10.3 (L) 07/09/2020   HCT 31.2 (L) 07/09/2020   MCV 96.9 07/09/2020   PLT 156 07/09/2020  CMP Latest Ref Rng & Units 07/08/2020  Glucose 70 - 99 mg/dL 74  BUN 6 - 20 mg/dL 8  Creatinine 0.44 - 1.00 mg/dL 0.68  Sodium 135 - 145 mmol/L 139  Potassium 3.5 - 5.1 mmol/L 4.1  Chloride 98 - 111 mmol/L 106  CO2 22 - 32 mmol/L 24  Calcium 8.9 - 10.3 mg/dL 8.8(L)  Total Protein 6.5 - 8.1 g/dL 5.6(L)  Total Bilirubin 0.3 - 1.2 mg/dL 0.4  Alkaline Phos 38 - 126 U/L 102  AST 15 - 41 U/L 18  ALT 0 - 44 U/L 13   Edinburgh Score: Edinburgh Postnatal Depression Scale Screening Tool 07/09/2020  I have been able to laugh and see the funny side of things. 0  I have looked forward with enjoyment to things. 0  I have blamed myself unnecessarily when things went wrong. 1  I have been anxious or worried for no good reason. 2  I have felt scared or panicky for no good reason. 1  Things have been getting on top of me. 1  I have been so unhappy that I have had difficulty sleeping. 0  I have felt sad or miserable. 0  I have been so unhappy that I have been crying. 0  The thought of harming myself has occurred to me. 0   Edinburgh Postnatal Depression Scale Total 5      After visit meds:  Allergies as of 07/10/2020      Reactions   Oxycodone Itching   Particularly severe itching not responding to oral Benadryl.  Hydrocodone seems to be OK   Latex Rash      Medication List    STOP taking these medications   metFORMIN 500 MG tablet Commonly known as: GLUCOPHAGE     TAKE these medications   calcium carbonate 500 MG chewable tablet Commonly known as: TUMS - dosed in mg elemental calcium Chew 1-2 tablets by mouth 3 (three) times daily as needed for indigestion or heartburn.   escitalopram 10 MG tablet Commonly known as: LEXAPRO Take 10 mg by mouth every evening.   ferrous sulfate 325 (65 FE) MG tablet Take 1 tablet (325 mg total) by mouth daily.   hydrochlorothiazide 12.5 MG capsule Commonly known as: MICROZIDE Take 1 capsule (12.5 mg total) by mouth daily.   HYDROcodone-acetaminophen 5-325 MG tablet Commonly known as: NORCO/VICODIN Take 1-2 tablets by mouth every 4 (four) hours as needed for moderate pain.   ibuprofen 800 MG tablet Commonly known as: ADVIL Take 1 tablet (800 mg total) by mouth every 6 (six) hours.        Discharge home in stable condition Infant Feeding: Bottle Infant Disposition:home with mother Discharge instruction: per After Visit Summary and Postpartum booklet. Activity: Advance as tolerated. Pelvic rest for 6 weeks.  Diet: low salt diet Anticipated Birth Control: Unsure Postpartum Appointment:1 week, 6 weeks  Additional Postpartum F/U: BP check 1 week Future Appointments:No future appointments. Follow up Visit:  Follow-up Information    Brien Few, MD. Schedule an appointment as soon as possible for a visit in 1 week(s).   Specialty: Obstetrics and Gynecology Why: Blood pressure yet Contact information: [address] [place] [address] [phone]              Pt. Notified by phone at 5:15 pm that she can discontinue Metformin.       07/10/2020 Darliss Cheney, CNM

## 2020-07-10 NOTE — Progress Notes (Signed)
RN spoke with MOB on the phone and reviewed AVS. All questions answered and pt verbalized understanding.

## 2020-07-10 NOTE — Progress Notes (Addendum)
AVS was printed and left in MOB room with instructions that RN will review discharged instructions with baby's discharged instructions. Baby was discharged by another RN and was told by [facility] that she was discharged and her AVS was Reviewed. RN informed colleague that  MOB  AVS was not reviewed. RN called MOM cell phone several times and no one answered. MOB voicemail is full. MOB discharged without reviewing AVS.

## 2020-07-18 DIAGNOSIS — Z5189 Encounter for other specified aftercare: Secondary | ICD-10-CM | POA: Diagnosis not present

## 2020-07-18 DIAGNOSIS — O165 Unspecified maternal hypertension, complicating the puerperium: Secondary | ICD-10-CM | POA: Diagnosis not present

## 2020-07-26 ENCOUNTER — Inpatient Hospital Stay (HOSPITAL_COMMUNITY): Admit: 2020-07-26 | Payer: Self-pay

## 2020-08-07 ENCOUNTER — Encounter (HOSPITAL_COMMUNITY): Payer: Self-pay | Admitting: Obstetrics and Gynecology

## 2020-09-03 DIAGNOSIS — Z01419 Encounter for gynecological examination (general) (routine) without abnormal findings: Secondary | ICD-10-CM | POA: Diagnosis not present

## 2020-09-03 DIAGNOSIS — Z30430 Encounter for insertion of intrauterine contraceptive device: Secondary | ICD-10-CM | POA: Diagnosis not present

## 2020-10-07 DIAGNOSIS — N939 Abnormal uterine and vaginal bleeding, unspecified: Secondary | ICD-10-CM | POA: Diagnosis not present

## 2021-05-08 DIAGNOSIS — F418 Other specified anxiety disorders: Secondary | ICD-10-CM | POA: Diagnosis not present

## 2021-05-08 DIAGNOSIS — R030 Elevated blood-pressure reading, without diagnosis of hypertension: Secondary | ICD-10-CM | POA: Diagnosis not present

## 2021-05-28 DIAGNOSIS — S134XXA Sprain of ligaments of cervical spine, initial encounter: Secondary | ICD-10-CM | POA: Diagnosis not present

## 2021-05-28 DIAGNOSIS — S233XXA Sprain of ligaments of thoracic spine, initial encounter: Secondary | ICD-10-CM | POA: Diagnosis not present

## 2021-05-28 DIAGNOSIS — S338XXA Sprain of other parts of lumbar spine and pelvis, initial encounter: Secondary | ICD-10-CM | POA: Diagnosis not present

## 2021-06-04 DIAGNOSIS — S338XXA Sprain of other parts of lumbar spine and pelvis, initial encounter: Secondary | ICD-10-CM | POA: Diagnosis not present

## 2021-06-04 DIAGNOSIS — S134XXA Sprain of ligaments of cervical spine, initial encounter: Secondary | ICD-10-CM | POA: Diagnosis not present

## 2021-06-04 DIAGNOSIS — S233XXA Sprain of ligaments of thoracic spine, initial encounter: Secondary | ICD-10-CM | POA: Diagnosis not present

## 2021-07-03 DIAGNOSIS — R030 Elevated blood-pressure reading, without diagnosis of hypertension: Secondary | ICD-10-CM | POA: Diagnosis not present

## 2021-07-03 DIAGNOSIS — E669 Obesity, unspecified: Secondary | ICD-10-CM | POA: Diagnosis not present

## 2021-07-03 DIAGNOSIS — F418 Other specified anxiety disorders: Secondary | ICD-10-CM | POA: Diagnosis not present

## 2021-10-11 ENCOUNTER — Encounter (INDEPENDENT_AMBULATORY_CARE_PROVIDER_SITE_OTHER): Payer: Self-pay

## 2021-12-16 ENCOUNTER — Other Ambulatory Visit: Payer: Self-pay | Admitting: Plastic Surgery

## 2021-12-16 DIAGNOSIS — Z1231 Encounter for screening mammogram for malignant neoplasm of breast: Secondary | ICD-10-CM

## 2021-12-21 ENCOUNTER — Ambulatory Visit: Payer: BC Managed Care – PPO

## 2021-12-22 DIAGNOSIS — U071 COVID-19: Secondary | ICD-10-CM | POA: Diagnosis not present

## 2022-01-08 ENCOUNTER — Other Ambulatory Visit: Payer: Self-pay

## 2022-01-08 ENCOUNTER — Ambulatory Visit
Admission: RE | Admit: 2022-01-08 | Discharge: 2022-01-08 | Disposition: A | Discharge status: Home / Self Care | Payer: BC Managed Care – PPO | Source: Ambulatory Visit | Attending: Plastic Surgery | Admitting: Plastic Surgery

## 2022-01-08 DIAGNOSIS — Z1231 Encounter for screening mammogram for malignant neoplasm of breast: Secondary | ICD-10-CM | POA: Diagnosis not present

## 2022-01-13 DIAGNOSIS — R000 Tachycardia, unspecified: Secondary | ICD-10-CM | POA: Diagnosis not present

## 2022-01-13 DIAGNOSIS — R232 Flushing: Secondary | ICD-10-CM | POA: Diagnosis not present

## 2022-01-13 DIAGNOSIS — R519 Headache, unspecified: Secondary | ICD-10-CM | POA: Diagnosis not present

## 2022-01-13 DIAGNOSIS — R0989 Other specified symptoms and signs involving the circulatory and respiratory systems: Secondary | ICD-10-CM | POA: Diagnosis not present

## 2022-01-19 DIAGNOSIS — R000 Tachycardia, unspecified: Secondary | ICD-10-CM | POA: Diagnosis not present

## 2022-01-19 DIAGNOSIS — R232 Flushing: Secondary | ICD-10-CM | POA: Diagnosis not present

## 2022-03-29 DIAGNOSIS — E6609 Other obesity due to excess calories: Secondary | ICD-10-CM | POA: Diagnosis not present

## 2022-03-29 DIAGNOSIS — E663 Overweight: Secondary | ICD-10-CM | POA: Diagnosis not present

## 2022-03-29 DIAGNOSIS — R5382 Chronic fatigue, unspecified: Secondary | ICD-10-CM | POA: Diagnosis not present

## 2022-03-29 DIAGNOSIS — L658 Other specified nonscarring hair loss: Secondary | ICD-10-CM | POA: Diagnosis not present

## 2022-03-29 DIAGNOSIS — N926 Irregular menstruation, unspecified: Secondary | ICD-10-CM | POA: Diagnosis not present

## 2022-06-02 DIAGNOSIS — R002 Palpitations: Secondary | ICD-10-CM | POA: Diagnosis not present

## 2022-06-02 DIAGNOSIS — M549 Dorsalgia, unspecified: Secondary | ICD-10-CM | POA: Diagnosis not present

## 2022-06-02 DIAGNOSIS — R768 Other specified abnormal immunological findings in serum: Secondary | ICD-10-CM | POA: Diagnosis not present

## 2022-06-16 ENCOUNTER — Encounter (INDEPENDENT_AMBULATORY_CARE_PROVIDER_SITE_OTHER): Payer: Self-pay

## 2022-12-14 ENCOUNTER — Other Ambulatory Visit: Payer: Self-pay | Admitting: Unknown Physician Specialty

## 2022-12-14 DIAGNOSIS — Z1231 Encounter for screening mammogram for malignant neoplasm of breast: Secondary | ICD-10-CM

## 2023-02-16 DIAGNOSIS — N760 Acute vaginitis: Secondary | ICD-10-CM | POA: Diagnosis not present

## 2023-02-16 DIAGNOSIS — N939 Abnormal uterine and vaginal bleeding, unspecified: Secondary | ICD-10-CM | POA: Diagnosis not present

## 2024-04-06 ENCOUNTER — Ambulatory Visit (HOSPITAL_BASED_OUTPATIENT_CLINIC_OR_DEPARTMENT_OTHER)
Admission: RE | Admit: 2024-04-06 | Discharge: 2024-04-06 | Disposition: A | Discharge status: Home / Self Care | Source: Ambulatory Visit | Attending: Nurse Practitioner | Admitting: Nurse Practitioner

## 2024-04-06 ENCOUNTER — Other Ambulatory Visit (HOSPITAL_COMMUNITY): Payer: Self-pay | Admitting: Nurse Practitioner

## 2024-04-06 DIAGNOSIS — R102 Pelvic and perineal pain: Secondary | ICD-10-CM

## 2024-04-06 DIAGNOSIS — R319 Hematuria, unspecified: Secondary | ICD-10-CM

## 2024-04-06 DIAGNOSIS — K625 Hemorrhage of anus and rectum: Secondary | ICD-10-CM | POA: Insufficient documentation

## 2024-04-06 DIAGNOSIS — K6289 Other specified diseases of anus and rectum: Secondary | ICD-10-CM
